# Patient Record
Sex: Male | Born: 1940 | Race: White | Hispanic: No | Smoking: Never smoker
Health system: Southern US, Community
[De-identification: ages and names within clinical notes are randomized; demographics above are authoritative.]

## PROBLEM LIST (undated history)

## (undated) DIAGNOSIS — N529 Male erectile dysfunction, unspecified: Secondary | ICD-10-CM

## (undated) DIAGNOSIS — E079 Disorder of thyroid, unspecified: Secondary | ICD-10-CM

## (undated) DIAGNOSIS — M109 Gout, unspecified: Secondary | ICD-10-CM

## (undated) DIAGNOSIS — R310 Gross hematuria: Secondary | ICD-10-CM

## (undated) DIAGNOSIS — N200 Calculus of kidney: Secondary | ICD-10-CM

## (undated) DIAGNOSIS — N400 Enlarged prostate without lower urinary tract symptoms: Secondary | ICD-10-CM

## (undated) DIAGNOSIS — I10 Essential (primary) hypertension: Secondary | ICD-10-CM

## (undated) DIAGNOSIS — K219 Gastro-esophageal reflux disease without esophagitis: Secondary | ICD-10-CM

## (undated) DIAGNOSIS — J45909 Unspecified asthma, uncomplicated: Secondary | ICD-10-CM

## (undated) DIAGNOSIS — R972 Elevated prostate specific antigen [PSA]: Secondary | ICD-10-CM

## (undated) DIAGNOSIS — R350 Frequency of micturition: Secondary | ICD-10-CM

## (undated) DIAGNOSIS — T7840XA Allergy, unspecified, initial encounter: Secondary | ICD-10-CM

## (undated) HISTORY — DX: Allergy, unspecified, initial encounter: T78.40XA

## (undated) HISTORY — DX: Disorder of thyroid, unspecified: E07.9

## (undated) HISTORY — DX: Calculus of kidney: N20.0

## (undated) HISTORY — DX: Gout, unspecified: M10.9

## (undated) HISTORY — DX: Elevated prostate specific antigen (PSA): R97.20

## (undated) HISTORY — DX: Gross hematuria: R31.0

## (undated) HISTORY — DX: Benign prostatic hyperplasia without lower urinary tract symptoms: N40.0

## (undated) HISTORY — DX: Male erectile dysfunction, unspecified: N52.9

## (undated) HISTORY — PX: OTHER SURGICAL HISTORY: SHX169

## (undated) HISTORY — DX: Frequency of micturition: R35.0

## (undated) HISTORY — DX: Unspecified asthma, uncomplicated: J45.909

## (undated) HISTORY — DX: Essential (primary) hypertension: I10

---

## 2003-09-30 ENCOUNTER — Encounter (INDEPENDENT_AMBULATORY_CARE_PROVIDER_SITE_OTHER): Payer: Self-pay | Admitting: Specialist

## 2003-09-30 ENCOUNTER — Ambulatory Visit (HOSPITAL_COMMUNITY): Admission: RE | Admit: 2003-09-30 | Discharge: 2003-09-30 | Payer: Self-pay | Admitting: Gastroenterology

## 2006-11-08 ENCOUNTER — Ambulatory Visit: Payer: Self-pay | Admitting: Urology

## 2006-12-06 ENCOUNTER — Other Ambulatory Visit: Payer: Self-pay

## 2006-12-06 ENCOUNTER — Ambulatory Visit: Payer: Self-pay | Admitting: Urology

## 2006-12-08 ENCOUNTER — Ambulatory Visit: Payer: Self-pay | Admitting: Urology

## 2007-04-19 ENCOUNTER — Other Ambulatory Visit: Payer: Self-pay

## 2007-04-19 ENCOUNTER — Observation Stay: Payer: Self-pay | Admitting: Internal Medicine

## 2010-10-16 NOTE — Op Note (Signed)
NAME:  Henry Shepard, Henry Shepard                           ACCOUNT NO.:  [id number]   MEDICAL RECORD NO.:  [id number]                   PATIENT TYPE:  AMB   LOCATION:  ENDO                                 FACILITY:  [facility]   PHYSICIAN:  John C. Madilyn Fireman, M.D.                 DATE OF BIRTH:  1940/11/02   DATE OF PROCEDURE:  09/30/2003  DATE OF DISCHARGE:                                 OPERATIVE REPORT   PROCEDURE:  Colonoscopy with polypectomy.   INDICATIONS FOR PROCEDURE:  Average risk colon cancer screening in a 70-year-  old patient.   DESCRIPTION OF PROCEDURE:  The patient was placed in the left lateral  decubitus position and placed on the pulse monitor with continuous low-flow  oxygen delivered by nasal cannula.  He was sedated with 87.5 mcg IV fentanyl  and 9 mg IV Versed.  The Olympus video colonoscope was inserted into the  rectum and advanced to the cecum, confirmed by transillumination of  McBurney's point and visualization of the ileocecal valve and appendiceal  orifice.  Prep was good.  The cecum appeared normal with no masses, polyps,  diverticula, or other mucosal abnormalities.  In the ascending colon, there  was seen an 8 mm sessile polyp that was removed by snare.  The remainder of  the ascending, transverse, and descending colon appeared normal with no  further masses, polyps, diverticula, or mucosal abnormalities.  The sigmoid  colon had a 6 mm polyp that was removed by hot biopsy.  The remainder of the  sigmoid and the rectum appeared normal.  The scope was then withdrawn and  the patient returned to the recovery room in stable condition.  He tolerated  the procedure well and there were no immediate complications.   IMPRESSION:  Ascending and sigmoid colon polyps.   PLAN:  Await pathology to determine__________ colon screening.                                               John C. Madilyn Fireman, M.D.    J[facility]/MEDQ  D:  09/30/2003  T:  09/30/2003  Job:  [id number]   cc:   Caryn Bee L.  Little, M.D.  [address]  [place]  [place] [address]  Fax: [phone]

## 2012-01-04 ENCOUNTER — Ambulatory Visit: Payer: Self-pay | Admitting: Urology

## 2012-01-13 ENCOUNTER — Ambulatory Visit: Payer: Self-pay | Admitting: Urology

## 2012-01-27 ENCOUNTER — Ambulatory Visit: Payer: Self-pay | Admitting: Urology

## 2012-02-02 ENCOUNTER — Ambulatory Visit: Payer: Self-pay | Admitting: Urology

## 2012-02-29 ENCOUNTER — Ambulatory Visit: Payer: Self-pay | Admitting: Urology

## 2013-12-08 IMAGING — CR DG ABDOMEN 1V
1 series · 2 of 2 positions shown · non-contrast
Comparison: none

REASON FOR EXAM: possible kidney stones
COMMENTS:

[Series 1: ap · 0.17mm/px · 2 of 2 slices shown]
[im 1/2]
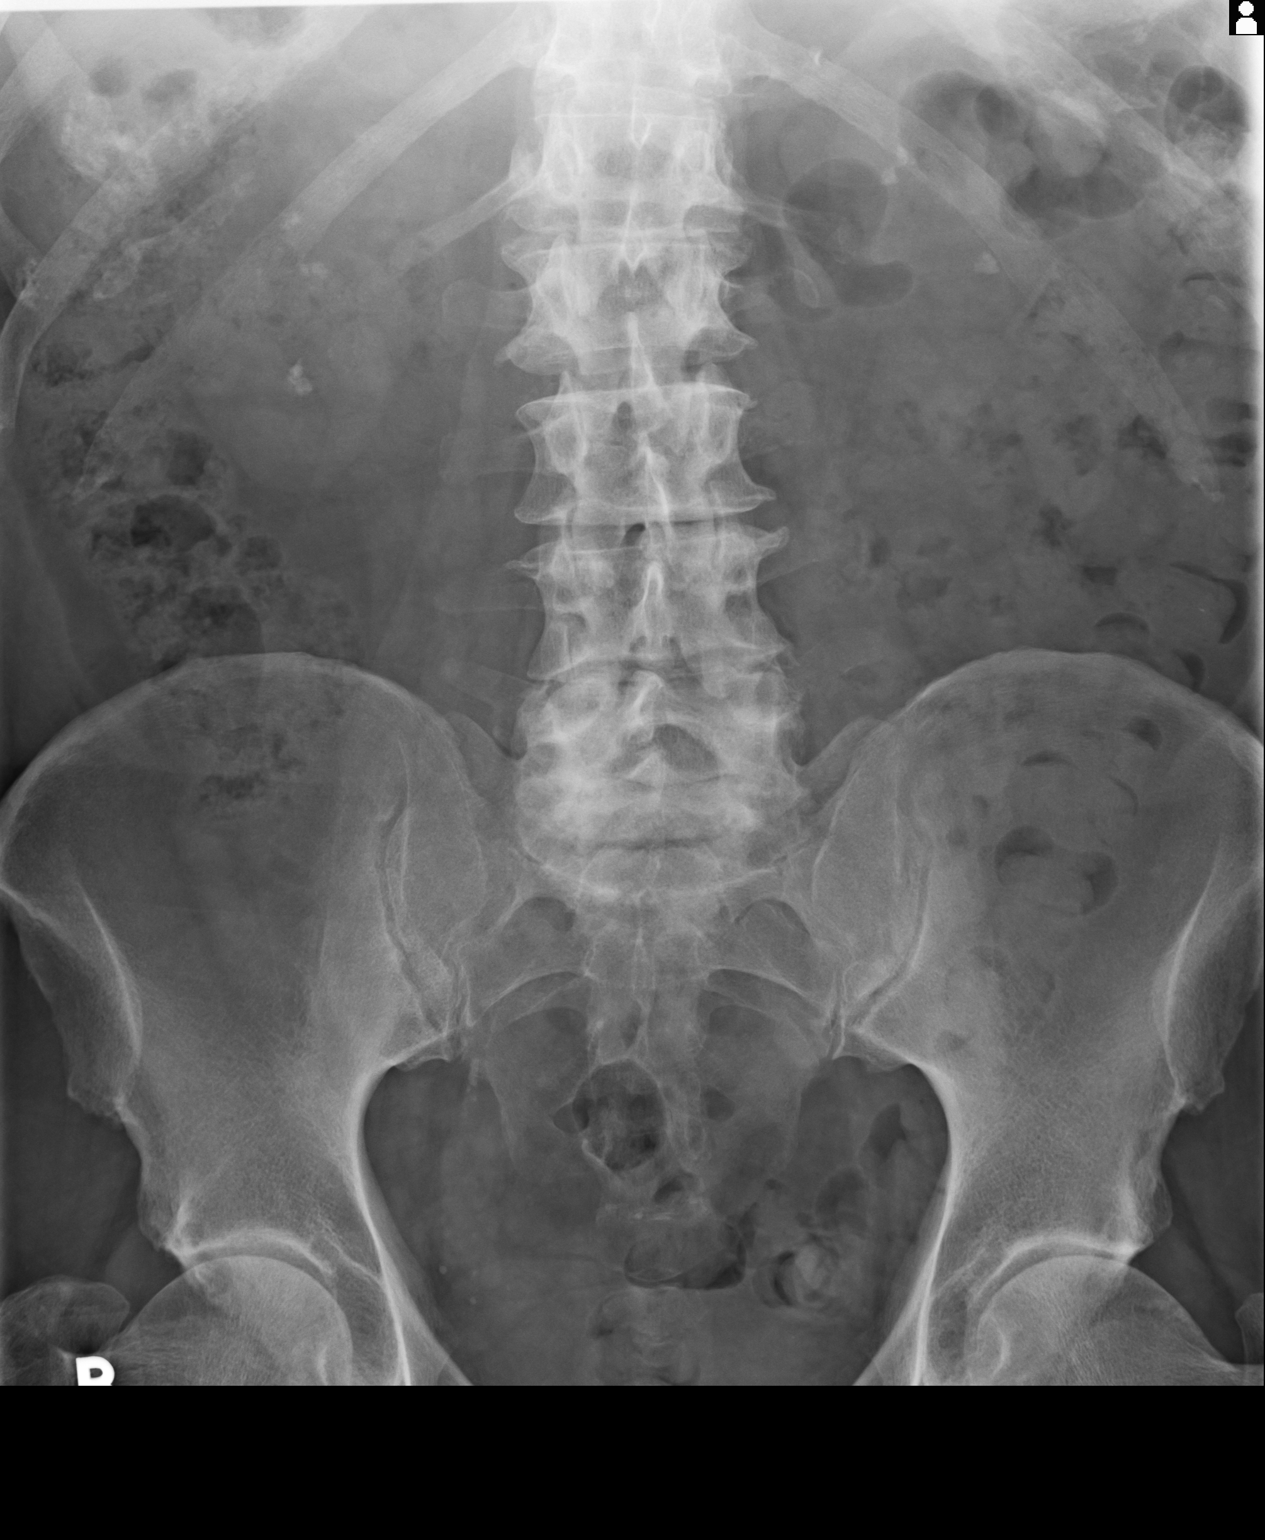
[im 2/2]
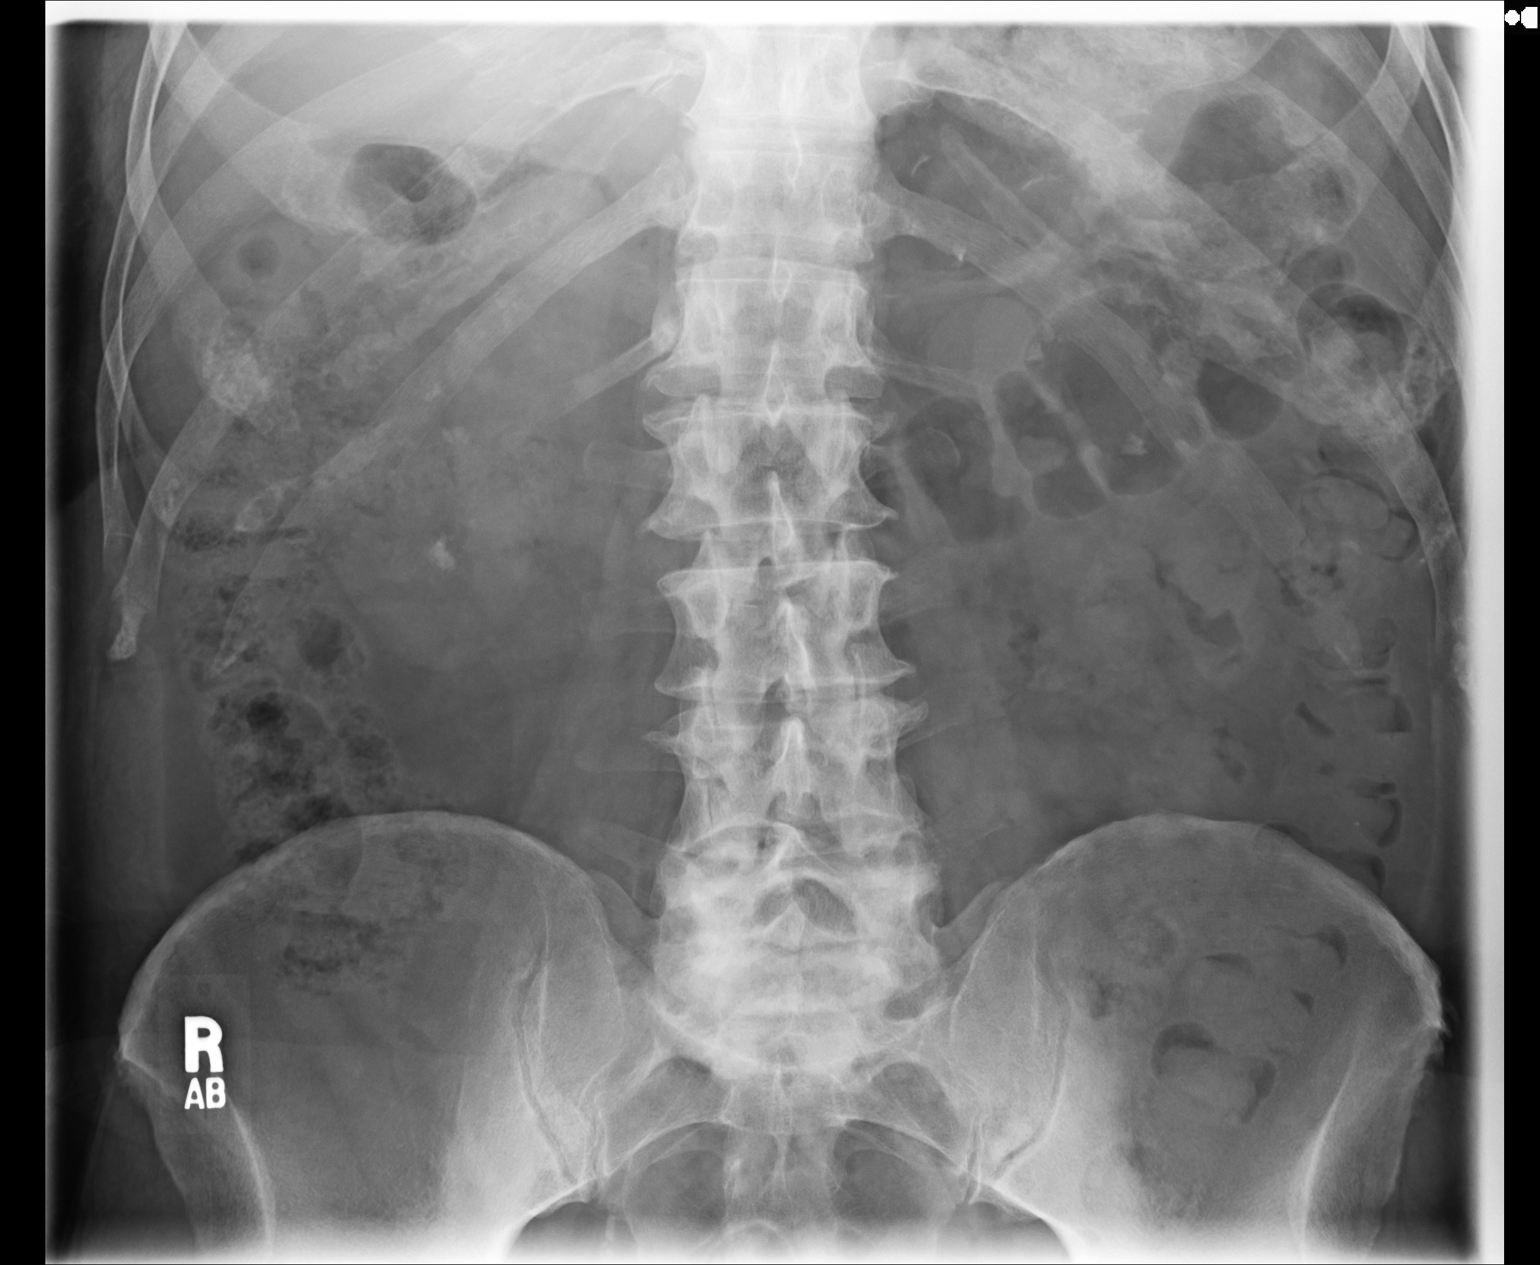

[2 of 2 positions shown; findings below may reference images not displayed]

PROCEDURE:     MDR - MDR KIDNEY URETER BLADDER  - February 02, 2012  [DATE]

RESULT:     A comparison is made to the study 27 January, 2012.

Bilateral multiple renal stones are present. No definite ureteral calculi
are appreciated. The bowel gas pattern is unremarkable. Phleboliths are
present in the right pelvis.
IMPRESSION: Bilateral nephrolithiasis. Prominent costochondral
calcification is noted. Degenerative changes are seen in the spine.

[REDACTED]

## 2014-12-24 ENCOUNTER — Encounter: Payer: Self-pay | Admitting: Urology

## 2014-12-24 ENCOUNTER — Ambulatory Visit (INDEPENDENT_AMBULATORY_CARE_PROVIDER_SITE_OTHER): Payer: Medicare Other | Admitting: Urology

## 2014-12-24 VITALS — BP 135/78 | HR 66 | Ht 71.0 in | Wt 200.9 lb

## 2014-12-24 DIAGNOSIS — N529 Male erectile dysfunction, unspecified: Secondary | ICD-10-CM

## 2014-12-24 DIAGNOSIS — N528 Other male erectile dysfunction: Secondary | ICD-10-CM | POA: Diagnosis not present

## 2014-12-24 DIAGNOSIS — N138 Other obstructive and reflux uropathy: Secondary | ICD-10-CM | POA: Insufficient documentation

## 2014-12-24 DIAGNOSIS — N401 Enlarged prostate with lower urinary tract symptoms: Secondary | ICD-10-CM

## 2014-12-24 DIAGNOSIS — Z87898 Personal history of other specified conditions: Secondary | ICD-10-CM | POA: Insufficient documentation

## 2014-12-24 LAB — BLADDER SCAN AMB NON-IMAGING: SCAN RESULT: 166

## 2014-12-24 MED ORDER — TAMSULOSIN HCL 0.4 MG PO CAPS: 0.4000 mg | ORAL_CAPSULE | Freq: Every day | ORAL | Status: DC

## 2014-12-24 NOTE — Progress Notes (Addendum)
12/24/2014 9:49 AM   Henry Shepard November 10, 1940 [id number]  Referring provider: No referring provider defined for this encounter.  Chief Complaint  Patient presents with  . Follow-up    PSA    HPI: Henry Shepard is a 74 year old white male with ED, a history of elevated PSA and BPH with LUTS who presents today for 6 months follow up.    His IPSS score today is 0, which is mild lower urinary tract symptomatology. He is delighted with his quality life due to his urinary symptoms. His PVR is 166 mL.    He has no complaints today.  He denies any dysuria, hematuria or suprapubic pain.   He currently taking tamsulosin.  He had been on Avodart in the remote past.   His has had a PSA of 26.9 ng/mL with negative biopsy on 11/10/2006.  Previous PSA's:    3.2 ng/mL on 12/18/2012    3.9 ng/mL on 06/22/2013    3.5 ng/mL on 12/21/2013    3.9 ng/mL on 06/24/2014     He also denies any recent fevers, chills, nausea or vomiting.  He does not have a family history of PCa.      IPSS      12/24/14 0900       International Prostate Symptom Score   How often have you had the sensation of not emptying your bladder? Not at All     How often have you had to urinate less than every two hours? Not at All     How often have you found you stopped and started again several times when you urinated? Not at All     How often have you found it difficult to postpone urination? Not at All     How often have you had a weak urinary stream? Not at All     How often have you had to strain to start urination? Not at All     How many times did you typically get up at night to urinate? None     Total IPSS Score 0     Quality of Life due to urinary symptoms   If you were to spend the rest of your life with your urinary condition just the way it is now how would you feel about that? Delighted        Score:  1-7 Mild 8-19 Moderate 20-35 Severe  His SHIM score is 9, which is moderate ED.   He has been  having difficulty with erections for over ten years .   His major complaint is getting and keeping an erection.  His libido is good.   His risk factors for ED are hypertension and hypothyroidism.  He denies any painful erections or curvatures with his erections.   He has tried Viagra and Cialis in the past.  Cialis is the most effective.          SHIM      12/24/14 0940       SHIM: Over the last 6 months:   How do you rate your confidence that you could get and keep an erection? Low     When you had erections with sexual stimulation, how often were your erections hard enough for penetration (entering your partner)? A Few Times (much less than half the time)     During sexual intercourse, how often were you able to maintain your erection after you had penetrated (entered) your partner? Very Difficult  During sexual intercourse, how difficult was it to maintain your erection to completion of intercourse? Very Difficult     When you attempted sexual intercourse, how often was it satisfactory for you? Extremely Difficult     SHIM Total Score   SHIM 9        Score: 1-7 Severe ED 8-11 Moderate ED 12-16 Mild-Moderate ED 17-21 Mild ED 22-25 No ED    He has a h/o elevated PSA.  He underwent a biopsy on 11/10/2006 for a PSA of 26.9 ng/mL with negative results.  So far, his repeated PSA's have been below 4.     PMH: Past Medical History  Diagnosis Date  . Kidney stone   . Hypertension   . Thyroid disease     Surgical History: History reviewed. No pertinent past surgical history.  Home Medications:    Medication List       This list is accurate as of: 12/24/14  9:49 AM.  Always use your most recent med list.               allopurinol 100 MG tablet  Commonly known as:  ZYLOPRIM  Take 100 mg by mouth daily.     metoprolol succinate 25 MG 24 hr tablet  Commonly known as:  TOPROL-XL  Take 25 mg by mouth daily.     SYNTHROID 50 MCG tablet  Generic drug:  levothyroxine   Take 50 mcg by mouth every morning.     tamsulosin 0.4 MG Caps capsule  Commonly known as:  FLOMAX  Take 0.4 mg by mouth daily.        Allergies: No Known Allergies  Family History: Family History  Problem Relation Age of Onset  . Kidney cancer Neg Hx   . Kidney disease Neg Hx   . Prostate cancer Neg Hx     Social History:  reports that he has never smoked. He does not have any smokeless tobacco history on file. He reports that he does not drink alcohol or use illicit drugs.  ROS: UROLOGY Frequent Urination?: No Hard to postpone urination?: No Burning/pain with urination?: No Get up at night to urinate?: No Leakage of urine?: No Urine stream starts and stops?: No Trouble starting stream?: No Do you have to strain to urinate?: No Blood in urine?: No Urinary tract infection?: No Sexually transmitted disease?: No Injury to kidneys or bladder?: No Painful intercourse?: No Weak stream?: No Erection problems?: Yes Penile pain?: No  Gastrointestinal Nausea?: No Vomiting?: No Indigestion/heartburn?: No Diarrhea?: No Constipation?: No  Constitutional Fever: No Night sweats?: No Weight loss?: No Fatigue?: No  Skin Skin rash/lesions?: Yes Itching?: No  Eyes Blurred vision?: No Double vision?: No  Ears/Nose/Throat Sore throat?: No Sinus problems?: No  Hematologic/Lymphatic Swollen glands?: No Easy bruising?: Yes  Cardiovascular Leg swelling?: No Chest pain?: No  Respiratory Cough?: No Shortness of breath?: No  Endocrine Excessive thirst?: No  Musculoskeletal Back pain?: No Joint pain?: No  Neurological Headaches?: No Dizziness?: No  Psychologic Depression?: No Anxiety?: No  Physical Exam: BP 135/78 mmHg  Pulse 66  Ht 5\' 11"  (1.803 m)  Wt 200 lb 14.4 oz (91.128 kg)  BMI 28.03 kg/m2  GU: Patient with uncircumcised  phallus. Foreskin easily retracted Urethral meatus is patent.  No penile discharge. Mild balanitis is present.   Scrotum without lesions, cysts, rashes and/or edema.  Testicles are located scrotally bilaterally. No masses are appreciated in the testicles. Left and right epididymis are normal. Rectal: Patient with  normal sphincter tone. Perineum without scarring or rashes. No rectal masses are appreciated. Prostate is approximately 50 grams, no nodules are appreciated. Seminal vesicles are normal. Of note, his right hip is bruised from a fall.    Laboratory Data: Results for orders placed or performed in visit on 12/24/14  BLADDER SCAN AMB NON-IMAGING  Result Value Ref Range   Scan Result 166    No results found for: WBC, HGB, HCT, MCV, PLT  No results found for: CREATININE  No results found for: PSA  No results found for: TESTOSTERONE  No results found for: HGBA1C  Urinalysis No results found for: COLORURINE, APPEARANCEUR, LABSPEC, PHURINE, GLUCOSEU, HGBUR, BILIRUBINUR, KETONESUR, PROTEINUR, UROBILINOGEN, NITRITE, LEUKOCYTESUR  Pertinent Imaging:   Assessment & Plan:    1. BPH with LUTS:   Patient's IPSS score is 0/0.  His PVR 166 mL.  His DRE demonstrates slight enlargement.  Patient will continue the tamsulosin.   We will continue medical management at this time.  He will follow up in 6 months for a PSA, DRE, PVR and an IPSS.    - BLADDER SCAN AMB NON-IMAGING  2. Erectile dysfunction:   Patient's SHIM score is 9.  He finds the Cialis effective, but cost prohibitive.  He is given samples of Cialis 20 mg today.    3. History of elevated PSA:   He underwent a biopsy on 11/10/2006 for a PSA of 26.9 ng/mL with negative results. We will continue to monitor closely and will see him in 6 months for a PSA and DRE.   Previous PSA's:    3.2 ng/mL on 12/18/2012    3.9 ng/mL on 06/22/2013    3.5 ng/mL on 12/21/2013    3.9 ng/mL on 06/24/2014      - PSA   No Follow-up on file.  Michiel Cowboy, PA-C  [facility] [address] [place], [place]  [address] [phone]

## 2014-12-25 ENCOUNTER — Telehealth: Payer: Self-pay

## 2014-12-25 DIAGNOSIS — R972 Elevated prostate specific antigen [PSA]: Secondary | ICD-10-CM

## 2014-12-25 LAB — PSA: PROSTATE SPECIFIC AG, SERUM: 3.4 ng/mL (ref 0.0–4.0)

## 2014-12-25 NOTE — Telephone Encounter (Signed)
-----   Message from Harle Battiest, PA-C sent at 12/25/2014  9:07 AM EDT ----- Patient's PSA is stable.  We will see him in 6 months.  PSA to be drawn before his next appointment.

## 2014-12-25 NOTE — Telephone Encounter (Signed)
Spoke with pt in reference to lab results. Pt voiced understanding. Orders have been placed.  

## 2015-06-15 ENCOUNTER — Encounter: Payer: Self-pay | Admitting: Emergency Medicine

## 2015-06-15 ENCOUNTER — Emergency Department
Admission: EM | Admit: 2015-06-15 | Discharge: 2015-06-15 | Disposition: A | Discharge status: Home / Self Care | Payer: Medicare Other | Attending: Emergency Medicine | Admitting: Emergency Medicine

## 2015-06-15 DIAGNOSIS — W06XXXA Fall from bed, initial encounter: Secondary | ICD-10-CM | POA: Diagnosis not present

## 2015-06-15 DIAGNOSIS — Y9289 Other specified places as the place of occurrence of the external cause: Secondary | ICD-10-CM | POA: Diagnosis not present

## 2015-06-15 DIAGNOSIS — Y998 Other external cause status: Secondary | ICD-10-CM | POA: Insufficient documentation

## 2015-06-15 DIAGNOSIS — S61412A Laceration without foreign body of left hand, initial encounter: Secondary | ICD-10-CM | POA: Diagnosis present

## 2015-06-15 DIAGNOSIS — Z79899 Other long term (current) drug therapy: Secondary | ICD-10-CM | POA: Diagnosis not present

## 2015-06-15 DIAGNOSIS — Y9389 Activity, other specified: Secondary | ICD-10-CM | POA: Insufficient documentation

## 2015-06-15 DIAGNOSIS — I10 Essential (primary) hypertension: Secondary | ICD-10-CM | POA: Insufficient documentation

## 2015-06-15 MED ORDER — CEPHALEXIN 500 MG PO CAPS: 500.0000 mg | ORAL_CAPSULE | Freq: Three times a day (TID) | ORAL | Status: AC

## 2015-06-15 MED ORDER — LIDOCAINE-EPINEPHRINE (PF) 1 %-1:200000 IJ SOLN
INTRAMUSCULAR | Status: AC
  Administered 2015-06-15: 30 mL via INTRADERMAL
  Filled 2015-06-15: qty 30

## 2015-06-15 MED ORDER — LIDOCAINE-EPINEPHRINE (PF) 1 %-1:200000 IJ SOLN
30.0000 mL | Freq: Once | INTRAMUSCULAR | Status: AC
  Administered 2015-06-15: 30 mL via INTRADERMAL

## 2015-06-15 NOTE — Discharge Instructions (Signed)
Laceration Care, Adult  A laceration is a cut that goes through all layers of the skin. The cut also goes into the tissue that is right under the skin. Some cuts heal on their own. Others need to be closed with stitches (sutures), staples, skin adhesive strips, or wound glue. Taking care of your cut lowers your risk of infection and helps your cut to heal better.  HOW TO TAKE CARE OF YOUR CUT  For stitches or staples:  · Keep the wound clean and dry.  · If you were given a bandage (dressing), you should change it at least one time per day or as told by your doctor. You should also change it if it gets wet or dirty.  · Keep the wound completely dry for the first 24 hours or as told by your doctor. After that time, you may take a shower or a bath. However, make sure that the wound is not soaked in water until after the stitches or staples have been removed.  · Clean the wound one time each day or as told by your doctor:    Wash the wound with soap and water.    Rinse the wound with water until all of the soap comes off.    Pat the wound dry with a clean towel. Do not rub the wound.  · After you clean the wound, put a thin layer of antibiotic ointment on it as told by your doctor. This ointment:    Helps to prevent infection.    Keeps the bandage from sticking to the wound.  · Have your stitches or staples removed as told by your doctor.  If your doctor used skin adhesive strips:   · Keep the wound clean and dry.  · If you were given a bandage, you should change it at least one time per day or as told by your doctor. You should also change it if it gets dirty or wet.  · Do not get the skin adhesive strips wet. You can take a shower or a bath, but be careful to keep the wound dry.  · If the wound gets wet, pat it dry with a clean towel. Do not rub the wound.  · Skin adhesive strips fall off on their own. You can trim the strips as the wound heals. Do not remove any strips that are still stuck to the wound. They will  fall off after a while.  If your doctor used wound glue:  · Try to keep your wound dry, but you may briefly wet it in the shower or bath. Do not soak the wound in water, such as by swimming.  · After you take a shower or a bath, gently pat the wound dry with a clean towel. Do not rub the wound.  · Do not do any activities that will make you really sweaty until the skin glue has fallen off on its own.  · Do not apply liquid, cream, or ointment medicine to your wound while the skin glue is still on.  · If you were given a bandage, you should change it at least one time per day or as told by your doctor. You should also change it if it gets dirty or wet.  · If a bandage is placed over the wound, do not let the tape for the bandage touch the skin glue.  · Do not pick at the glue. The skin glue usually stays on for 5-10 days. Then, it   falls off of the skin.  General Instructions   · To help prevent scarring, make sure to cover your wound with sunscreen whenever you are outside after stitches are removed, after adhesive strips are removed, or when wound glue stays in place and the wound is healed. Make sure to wear a sunscreen of at least 30 SPF.  · Take over-the-counter and prescription medicines only as told by your doctor.  · If you were given antibiotic medicine or ointment, take or apply it as told by your doctor. Do not stop using the antibiotic even if your wound is getting better.  · Do not scratch or pick at the wound.  · Keep all follow-up visits as told by your doctor. This is important.  · Check your wound every day for signs of infection. Watch for:    Redness, swelling, or pain.    Fluid, blood, or pus.  · Raise (elevate) the injured area above the level of your heart while you are sitting or lying down, if possible.  GET HELP IF:  · You got a tetanus shot and you have any of these problems at the injection site:    Swelling.    Very bad pain.    Redness.    Bleeding.  · You have a fever.  · A wound that was  closed breaks open.  · You notice a bad smell coming from your wound or your bandage.  · You notice something coming out of the wound, such as wood or glass.  · Medicine does not help your pain.  · You have more redness, swelling, or pain at the site of your wound.  · You have fluid, blood, or pus coming from your wound.  · You notice a change in the color of your skin near your wound.  · You need to change the bandage often because fluid, blood, or pus is coming from the wound.  · You start to have a new rash.  · You start to have numbness around the wound.  GET HELP RIGHT AWAY IF:  · You have very bad swelling around the wound.  · Your pain suddenly gets worse and is very bad.  · You notice painful lumps near the wound or on skin that is anywhere on your body.  · You have a red streak going away from your wound.  · The wound is on your hand or foot and you cannot move a finger or toe like you usually can.  · The wound is on your hand or foot and you notice that your fingers or toes look pale or bluish.     This information is not intended to replace advice given to you by your health care provider. Make sure you discuss any questions you have with your health care provider.     Document Released: 11/03/2007 Document Revised: 10/01/2014 Document Reviewed: 05/13/2014  Elsevier Interactive Patient Education ©2016 [employer]

## 2015-06-15 NOTE — ED Notes (Signed)
Patient presents to the ED with a laceration/skin tear on his left hand.  Patient states he was attempting to get in a bunk bed last night and fell and hurt his hand.  Patient is unsure what he hit his hand on. Patient denies hitting his head, denies losing consciousness.  Patient is a very spry [age] year old.

## 2015-06-15 NOTE — ED Provider Notes (Signed)
CSN: [id number]     Arrival date & time 06/15/15  1115 History   First MD Initiated Contact with Patient 06/15/15 1152     Chief Complaint  Patient presents with  . Extremity Laceration     (Consider location/radiation/quality/duration/timing/severity/associated sxs/prior Treatment) HPI  75 year old male presents emergency department for evaluation of a left hand laceration. Laceration occurred sometime earlier this morning, he slipped and fell while getting out of the bed. Patient denies any significant pain but has a laceration to left hand. This was cleansed and dressed at home. He states his tetanus is up-to-date. Pain is mild. Denies any pain with full composite fist. He is not on any anticoagulant for medications.  Past Medical History  Diagnosis Date  . Kidney stone   . Hypertension   . Thyroid disease    History reviewed. No pertinent past surgical history. Family History  Problem Relation Age of Onset  . Kidney cancer Neg Hx   . Kidney disease Neg Hx   . Prostate cancer Neg Hx    Social History  Substance Use Topics  . Smoking status: Never Smoker   . Smokeless tobacco: None  . Alcohol Use: No    Review of Systems  Constitutional: Negative.  Negative for fever, chills, activity change and appetite change.  HENT: Negative for congestion, ear pain, mouth sores, rhinorrhea, sinus pressure, sore throat and trouble swallowing.   Eyes: Negative for photophobia, pain and discharge.  Respiratory: Negative for cough, chest tightness and shortness of breath.   Cardiovascular: Negative for chest pain and leg swelling.  Gastrointestinal: Negative for nausea, vomiting, abdominal pain, diarrhea and abdominal distention.  Genitourinary: Negative for dysuria and difficulty urinating.  Musculoskeletal: Negative for back pain, arthralgias and gait problem.  Skin: Positive for wound. Negative for color change and rash.  Neurological: Negative for dizziness and headaches.   Hematological: Negative for adenopathy.  Psychiatric/Behavioral: Negative for behavioral problems and agitation.      Allergies  Review of patient's allergies indicates no known allergies.  Home Medications   Prior to Admission medications   Medication Sig Start Date End Date Taking? Authorizing Provider  allopurinol (ZYLOPRIM) 100 MG tablet Take 100 mg by mouth daily. 11/17/14   Historical Provider, MD  cephALEXin (KEFLEX) 500 MG capsule Take 1 capsule (500 mg total) by mouth 3 (three) times daily. 06/15/15 06/25/15  Evon Slack, PA-C  metoprolol succinate (TOPROL-XL) 25 MG 24 hr tablet Take 25 mg by mouth daily. 11/17/14   Historical Provider, MD  SYNTHROID 50 MCG tablet Take 50 mcg by mouth every morning. 09/24/14   Historical Provider, MD  tamsulosin (FLOMAX) 0.4 MG CAPS capsule Take 1 capsule (0.4 mg total) by mouth daily. 12/24/14   Carollee Herter A McGowan, PA-C   BP 139/96 mmHg  Pulse 73  Temp(Src) 98.3 F (36.8 C) (Oral)  Resp 16  Ht 5\' 11"  (1.803 m)  Wt 86.183 kg  BMI 26.51 kg/m2  SpO2 100% Physical Exam  Constitutional: He is oriented to person, place, and time. He appears well-developed and well-nourished.  HENT:  Head: Normocephalic and atraumatic.  Eyes: Conjunctivae and EOM are normal. Pupils are equal, round, and reactive to light.  Neck: Normal range of motion. Neck supple.  Cardiovascular: Normal rate and intact distal pulses.   Pulmonary/Chest: Effort normal. No respiratory distress.  Abdominal: Soft. Bowel sounds are normal.  Musculoskeletal: Normal range of motion.  Examination of the left hand shows patient has full composite fist with grip strength 5 out of  5. There is a a similar laceration on the dorsal and ulnar aspect of the left hand. The tenderness palpation along the MCP joint or metacarpals. No sign of foreign body. Bleeding well controlled. Sensation intact throughout the left upper extremity.  Neurological: He is alert and oriented to person, place,  and time.  Skin: Skin is warm and dry.  Psychiatric: He has a normal mood and affect. His behavior is normal. Judgment and thought content normal.    ED Course  Procedures (including critical care time) LACERATION REPAIR Performed by: Patience MuscaGAINES, Deziree Mokry CHRISTOPHER Authorized by: Patience MuscaGAINES, Caytlin Better CHRISTOPHER Consent: Verbal consent obtained. Risks and benefits: risks, benefits and alternatives were discussed Consent given by: patient Patient identity confirmed: provided demographic data Prepped and Draped in normal sterile fashion Wound explored  Laceration Location: Left hand  Laceration Length: 8 cm  No Foreign Bodies seen or palpated  Anesthesia: local infiltration  Local anesthetic: lidocaine 1% % with epinephrine  Anesthetic total: 4 ml  Irrigation method: syringe Amount of cleaning: standard  Skin closure: 5-0 nylon   Number of sutures: 12   Technique: Running suture   Patient tolerance: Patient tolerated the procedure well with no immediate complications.  Labs Review Labs Reviewed - No data to display  Imaging Review No results found. I have personally reviewed and evaluated these images and lab results as part of my medical decision-making.   EKG Interpretation None      MDM   Final diagnoses:  Hand laceration, left, initial encounter    75 year old male with left hand laceration occurred earlier this morning. Tetanus is up-to-date. Laceration was repaired with sutures. He will keep clean and dry, change dressing daily. He is given prophylactic antibiotics, Keflex. Skin tears, he will keep covered and dry with nonocclusive dressing. Follow-up with emergency department or walk-in clinic in 7-10 days for suture removal.    Evon Slackhomas C Jarika Robben, PA-C 06/15/15 1249  Governor Rooksebecca Lord, MD 06/15/15 1527

## 2015-06-17 ENCOUNTER — Other Ambulatory Visit: Payer: Medicare Other

## 2015-06-17 DIAGNOSIS — R972 Elevated prostate specific antigen [PSA]: Secondary | ICD-10-CM

## 2015-06-18 LAB — PSA: Prostate Specific Ag, Serum: 3.5 ng/mL (ref 0.0–4.0)

## 2015-06-19 ENCOUNTER — Other Ambulatory Visit: Payer: Medicare Other

## 2015-06-24 ENCOUNTER — Encounter: Payer: Self-pay | Admitting: *Deleted

## 2015-06-26 ENCOUNTER — Ambulatory Visit: Payer: Medicare Other | Admitting: Urology

## 2015-06-30 ENCOUNTER — Ambulatory Visit (INDEPENDENT_AMBULATORY_CARE_PROVIDER_SITE_OTHER): Payer: Medicare Other | Admitting: Urology

## 2015-06-30 ENCOUNTER — Encounter: Payer: Self-pay | Admitting: Urology

## 2015-06-30 VITALS — BP 129/76 | HR 71 | Ht 71.0 in | Wt 194.9 lb

## 2015-06-30 DIAGNOSIS — Z87898 Personal history of other specified conditions: Secondary | ICD-10-CM

## 2015-06-30 DIAGNOSIS — N528 Other male erectile dysfunction: Secondary | ICD-10-CM

## 2015-06-30 DIAGNOSIS — N138 Other obstructive and reflux uropathy: Secondary | ICD-10-CM

## 2015-06-30 DIAGNOSIS — N529 Male erectile dysfunction, unspecified: Secondary | ICD-10-CM

## 2015-06-30 DIAGNOSIS — N401 Enlarged prostate with lower urinary tract symptoms: Secondary | ICD-10-CM

## 2015-06-30 DIAGNOSIS — N400 Enlarged prostate without lower urinary tract symptoms: Secondary | ICD-10-CM

## 2015-06-30 NOTE — Progress Notes (Signed)
10:35 AM   Henry Shepard [birth year] [id number]  Referring provider: Alan Mulder, MD [address] [place], [place] [address]  Chief Complaint  Patient presents with  . Benign Prostatic Hypertrophy    6 month follow up  . Erectile Dysfunction    HPI: [name] is a 75 year old white male with ED, a history of elevated PSA and BPH with LUTS who presents today for 6 months follow up.    Erectile dysfunction Patient's SHIM score is 5 today which  indicates severe erectile dysfunction.  He has been having difficulty with erections for over ten years .   His major complaint is getting and keeping an erection.  His libido is good.   His risk factors for ED are hypertension and hypothyroidism.  He denies any painful erections or curvatures with his erections.   He has tried Viagra and Cialis in the past.  Cialis is the most effective.  His girlfriend will not engage in sexual intercourse until they are married. The patient does not want to get married, so erectile dysfunction is not a bother for him at this time.  History of elevated PSA He underwent a biopsy on 11/10/2006 for a PSA of 26.9 ng/mL with negative results.  His most recent PSA is 3.5 ng/mL on 06/17/2015.    BPH with LUTS His IPSS score today is 0, which is no lower urinary tract symptomatology. He is delighted with his quality life due to his urinary symptoms.  He has no complaints today.  He denies any dysuria, hematuria or suprapubic pain.  He currently taking tamsulosin.  He also denies any recent fevers, chills, nausea or vomiting.  He does not have a family history of PCa.      IPSS      06/30/15 0900       International Prostate Symptom Score   How often have you had the sensation of not emptying your bladder? Not at All     How often have you had to urinate less than every two hours? Not at All     How often have you found you stopped and started again several times when you urinated? Not at All     How often  have you found it difficult to postpone urination? Not at All     How often have you had a weak urinary stream? Not at All     How often have you had to strain to start urination? Not at All     How many times did you typically get up at night to urinate? None     Total IPSS Score 0     Quality of Life due to urinary symptoms   If you were to spend the rest of your life with your urinary condition just the way it is now how would you feel about that? Delighted        Score:  1-7 Mild 8-19 Moderate 20-35 Severe        SHIM      06/30/15 0959       SHIM: Over the last 6 months:   How do you rate your confidence that you could get and keep an erection? Very Low     When you had erections with sexual stimulation, how often were your erections hard enough for penetration (entering your partner)? Almost Never or Never     During sexual intercourse, how often were you able to maintain your erection after  you had penetrated (entered) your partner? Extremely Difficult     During sexual intercourse, how difficult was it to maintain your erection to completion of intercourse? Extremely Difficult     When you attempted sexual intercourse, how often was it satisfactory for you? Extremely Difficult     SHIM Total Score   SHIM 5        Score: 1-7 Severe ED 8-11 Moderate ED 12-16 Mild-Moderate ED 17-21 Mild ED 22-25 No ED     PMH: Past Medical History  Diagnosis Date  . Kidney stone   . Hypertension   . Thyroid disease   . Gout   . Elevated PSA   . Urinary frequency   . Hypertension   . ED (erectile dysfunction)   . BPH (benign prostatic hyperplasia)   . Gross hematuria     Surgical History: Past Surgical History  Procedure Laterality Date  . None      Home Medications:    Medication List       This list is accurate as of: 06/30/15 10:35 AM.  Always use your most recent med list.               allopurinol 100 MG tablet  Commonly known as:  ZYLOPRIM  Take  100 mg by mouth daily.     metoprolol succinate 25 MG 24 hr tablet  Commonly known as:  TOPROL-XL  Take 25 mg by mouth daily.     SYNTHROID 50 MCG tablet  Generic drug:  levothyroxine  Take 50 mcg by mouth every morning.     tamsulosin 0.4 MG Caps capsule  Commonly known as:  FLOMAX  Take 1 capsule (0.4 mg total) by mouth daily.        Allergies: No Known Allergies  Family History: Family History  Problem Relation Age of Onset  . Kidney cancer Neg Hx   . Kidney disease Neg Hx   . Prostate cancer Neg Hx     Social History:  reports that he has never smoked. He does not have any smokeless tobacco history on file. He reports that he does not drink alcohol or use illicit drugs.  ROS: UROLOGY Frequent Urination?: No Hard to postpone urination?: No Burning/pain with urination?: No Get up at night to urinate?: No Leakage of urine?: No Urine stream starts and stops?: No Trouble starting stream?: No Do you have to strain to urinate?: No Blood in urine?: No Urinary tract infection?: No Sexually transmitted disease?: No Injury to kidneys or bladder?: No Painful intercourse?: No Weak stream?: No Erection problems?: No Penile pain?: No  Gastrointestinal Nausea?: No Vomiting?: No Indigestion/heartburn?: No Diarrhea?: No Constipation?: No  Constitutional Fever: No Night sweats?: No Weight loss?: No Fatigue?: No  Skin Skin rash/lesions?: No Itching?: No  Eyes Blurred vision?: No Double vision?: No  Ears/Nose/Throat Sore throat?: No Sinus problems?: No  Hematologic/Lymphatic Swollen glands?: No Easy bruising?: No  Cardiovascular Leg swelling?: No Chest pain?: No  Respiratory Cough?: No Shortness of breath?: No  Endocrine Excessive thirst?: No  Musculoskeletal Back pain?: No Joint pain?: No  Neurological Headaches?: No Dizziness?: No  Psychologic Depression?: No Anxiety?: No  Physical Exam: BP 129/76 mmHg  Pulse 71  Ht  (1.803  m)  Wt 194 lb 14.4 oz (88.406 kg)  BMI 27.20 kg/m2  GU: Patient with uncircumcised  phallus. Foreskin easily retracted Urethral meatus is patent.  No penile discharge. Mild balanitis is present.  Scrotum without lesions, cysts, rashes and/or edema.  Testicles are located scrotally bilaterally. No masses are appreciated in the testicles. Left and right epididymis are normal. Rectal: Patient with  normal sphincter tone. Perineum without scarring or rashes. No rectal masses are appreciated. Prostate is approximately 50 grams, no nodules are appreciated. Seminal vesicles are normal. Of note, his right hip is bruised from a fall.    Laboratory Data: Previous PSA's:    3.2 ng/mL on 12/18/2012    3.9 ng/mL on 06/22/2013    3.5 ng/mL on 12/21/2013    3.9 ng/mL on 06/24/2014  Results for orders placed or performed in visit on 06/17/15  PSA  Result Value Ref Range   Prostate Specific Ag, Serum 3.5 0.0 - 4.0 ng/mL     Assessment & Plan:    1. BPH with LUTS:   Patient will continue the tamsulosin.   We will continue medical management at this time.  He will follow up in 6 months for a PSA, exam, PVR and an IPSS.    2. Erectile dysfunction:   Patient's SHIM score is 5.  He finds the Cialis effective, but cost prohibitive.  His girlfriend is not interested in sexual activity at this time.  3. History of elevated PSA:   He underwent a biopsy on 11/10/2006 for a PSA of 26.9 ng/mL with negative results. We will continue to monitor closely and will see him in 6 months for a PSA and DRE.     Return in about 6 months (around 12/28/2015) for IPSS score and exam.  Michiel Cowboy, [facility] [address], [address] [place], [place] [address] [phone]

## 2015-08-22 ENCOUNTER — Other Ambulatory Visit: Payer: Self-pay | Admitting: Urology

## 2015-12-15 ENCOUNTER — Other Ambulatory Visit: Payer: Self-pay

## 2015-12-15 DIAGNOSIS — R972 Elevated prostate specific antigen [PSA]: Secondary | ICD-10-CM

## 2015-12-16 ENCOUNTER — Other Ambulatory Visit: Payer: Medicare Other

## 2015-12-16 DIAGNOSIS — R972 Elevated prostate specific antigen [PSA]: Secondary | ICD-10-CM

## 2015-12-17 LAB — PSA: Prostate Specific Ag, Serum: 3.1 ng/mL (ref 0.0–4.0)

## 2015-12-22 ENCOUNTER — Encounter: Payer: Self-pay | Admitting: Urology

## 2015-12-22 ENCOUNTER — Ambulatory Visit (INDEPENDENT_AMBULATORY_CARE_PROVIDER_SITE_OTHER): Payer: Medicare Other | Admitting: Urology

## 2015-12-22 VITALS — BP 138/79 | HR 62 | Ht 71.0 in | Wt 197.5 lb

## 2015-12-22 DIAGNOSIS — N200 Calculus of kidney: Secondary | ICD-10-CM | POA: Diagnosis not present

## 2015-12-22 DIAGNOSIS — N138 Other obstructive and reflux uropathy: Secondary | ICD-10-CM

## 2015-12-22 DIAGNOSIS — N529 Male erectile dysfunction, unspecified: Secondary | ICD-10-CM

## 2015-12-22 DIAGNOSIS — N401 Enlarged prostate with lower urinary tract symptoms: Secondary | ICD-10-CM

## 2015-12-22 DIAGNOSIS — Z87898 Personal history of other specified conditions: Secondary | ICD-10-CM | POA: Diagnosis not present

## 2015-12-22 DIAGNOSIS — N528 Other male erectile dysfunction: Secondary | ICD-10-CM | POA: Diagnosis not present

## 2015-12-22 MED ORDER — TAMSULOSIN HCL 0.4 MG PO CAPS: 0.4000 mg | ORAL_CAPSULE | Freq: Every day | ORAL | 4 refills | Status: DC

## 2015-12-22 NOTE — Progress Notes (Signed)
10:03 AM   Henry Shepard 06/03/1940 [id number]  Referring provider: Alan Mulder, MD [address] [place], [place] [address]  Chief Complaint  Patient presents with  . Benign Prostatic Hypertrophy    6 month follow up   . Erectile Dysfunction    HPI: Henry Shepard is a 75 year old Caucasian male with ED, a history of elevated PSA and BPH with LUTS who presents today for 6 months follow up.    Erectile dysfunction Patient's SHIM score is 5 today which  indicates severe erectile dysfunction.  He has been having difficulty with erections for over ten years .   His major complaint is getting and keeping an erection.  His libido is good.   His risk factors for ED are hypertension and hypothyroidism.  He denies any painful erections or curvatures with his erections.   He has tried Viagra and Cialis in the past.  Cialis is the most effective.  His girlfriend will not engage in sexual intercourse until they are married. The patient does not want to get married, so erectile dysfunction is not a bother for him at this time.  History of elevated PSA He underwent a biopsy on 11/10/2006 for a PSA of 26.9 ng/mL with negative results.  His most recent PSA is 3.1 ng/mL on 12/17/2015.    BPH with LUTS His IPSS score today is 0, which is no lower urinary tract symptomatology. He is delighted with his quality life due to his urinary symptoms.  He has no complaints today.  He denies any dysuria, hematuria or suprapubic pain.  He currently taking tamsulosin.  He also denies any recent fevers, chills, nausea or vomiting.  He does not have a family history of PCa.      IPSS    Row Name 12/22/15 0900         International Prostate Symptom Score   How often have you had the sensation of not emptying your bladder? Not at All     How often have you had to urinate less than every two hours? Not at All     How often have you found you stopped and started again several times when you urinated? Not at All      How often have you found it difficult to postpone urination? Not at All     How often have you had a weak urinary stream? Not at All     How often have you had to strain to start urination? Not at All     How many times did you typically get up at night to urinate? None     Total IPSS Score 0       Quality of Life due to urinary symptoms   If you were to spend the rest of your life with your urinary condition just the way it is now how would you feel about that? Delighted        Score:  1-7 Mild 8-19 Moderate 20-35 Severe   Nephrolithiasis Patient continues to pass stones at a frequency of one a year.  He does not have hematuria.  He has not had fevers, chills, nausea or vomiting.      PMH: Past Medical History:  Diagnosis Date  . BPH (benign prostatic hyperplasia)   . ED (erectile dysfunction)   . Elevated PSA   . Gout   . Gross hematuria   . Hypertension   . Hypertension   . Kidney stone   .  Thyroid disease   . Urinary frequency     Surgical History: Past Surgical History:  Procedure Laterality Date  . none      Home Medications:    Medication List       Accurate as of 12/22/15 10:03 AM. Always use your most recent med list.          allopurinol 100 MG tablet Commonly known as:  ZYLOPRIM Take 100 mg by mouth daily.   metoprolol succinate 25 MG 24 hr tablet Commonly known as:  TOPROL-XL Take 25 mg by mouth daily.   sildenafil 20 MG tablet Commonly known as:  REVATIO TAKE 2-5 TABLETS BY MOUTH AS NEEDED FOR SEXUAL ACTIVITY   SYNTHROID 50 MCG tablet Generic drug:  levothyroxine Take 50 mcg by mouth every morning.   tamsulosin 0.4 MG Caps capsule Commonly known as:  FLOMAX Take 1 capsule (0.4 mg total) by mouth daily.       Allergies: No Known Allergies  Family History: Family History  Problem Relation Age of Onset  . Kidney cancer Neg Hx   . Kidney disease Neg Hx   . Prostate cancer Neg Hx     Social History:  reports that he has  never smoked. He does not have any smokeless tobacco history on file. His alcohol and drug histories are not on file.  ROS: UROLOGY Frequent Urination?: No Hard to postpone urination?: No Burning/pain with urination?: No Get up at night to urinate?: No Leakage of urine?: No Urine stream starts and stops?: No Trouble starting stream?: No Do you have to strain to urinate?: No Blood in urine?: No Urinary tract infection?: No Sexually transmitted disease?: No Injury to kidneys or bladder?: No Painful intercourse?: No Weak stream?: No Erection problems?: No Penile pain?: No  Gastrointestinal Nausea?: No Vomiting?: No Indigestion/heartburn?: No Diarrhea?: No Constipation?: No  Constitutional Fever: No Night sweats?: No Weight loss?: No Fatigue?: No  Skin Skin rash/lesions?: No Itching?: No  Eyes Blurred vision?: No Double vision?: No  Ears/Nose/Throat Sore throat?: No Sinus problems?: No  Hematologic/Lymphatic Swollen glands?: No Easy bruising?: No  Cardiovascular Leg swelling?: No Chest pain?: No  Respiratory Cough?: No Shortness of breath?: No  Endocrine Excessive thirst?: No  Musculoskeletal Back pain?: No Joint pain?: No  Neurological Headaches?: No Dizziness?: No  Psychologic Depression?: No Anxiety?: No  Physical Exam: BP 138/79   Pulse 62   Ht  (1.803 m)   Wt 197 lb 8 oz (89.6 kg)   BMI 27.55 kg/m   GU: Patient with uncircumcised  phallus. Foreskin easily retracted Urethral meatus is patent.  No penile discharge. Mild balanitis is present.  Scrotum without lesions, cysts, rashes and/or edema.  Testicles are located scrotally bilaterally. No masses are appreciated in the testicles. Left and right epididymis are normal. Rectal: Patient with  normal sphincter tone. Perineum without scarring or rashes. No rectal masses are appreciated. Prostate is approximately 50 grams, no nodules are appreciated. Seminal vesicles are normal. Of  note, his right hip is bruised from a fall.    Laboratory Data: Previous PSA's:    3.2 ng/mL on 12/18/2012    3.9 ng/mL on 06/22/2013    3.5 ng/mL on 12/21/2013    3.9 ng/mL on 06/24/2014  Results for orders placed or performed in visit on 12/16/15  PSA  Result Value Ref Range   Prostate Specific Ag, Serum 3.1 0.0 - 4.0 ng/mL     Assessment & Plan:    1. BPH with LUTS:  Patient will continue the tamsulosin.   We will continue medical management at this time.  He will follow up in 6 months for a PSA, exam and an IPSS.    2. Erectile dysfunction:   Patient's SHIM score is 5.  He finds the Cialis effective, but cost prohibitive.  His girlfriend is not interested in sexual activity at this time.  3. History of elevated PSA:   He underwent a biopsy on 11/10/2006 for a PSA of 26.9 ng/mL with negative results. Current PSA is 3.1.  We will continue to monitor closely and will see him in 6 months for a PSA and DRE.    4. Nephrolithiasis:   Patient does not want any further work up for his stones at this time.     Return in about 6 months (around 06/23/2016) for IPSS, PSA and exam.  Michiel Cowboy, [facility]  [facility] [address] [place], [place] [address] [phone]

## 2016-05-31 HISTORY — PX: ESOPHAGEAL DILATION: SHX303

## 2016-05-31 HISTORY — PX: COLONOSCOPY W/ BIOPSIES AND POLYPECTOMY: SHX1376

## 2016-06-10 ENCOUNTER — Other Ambulatory Visit: Payer: Self-pay

## 2016-06-10 DIAGNOSIS — N401 Enlarged prostate with lower urinary tract symptoms: Secondary | ICD-10-CM

## 2016-06-15 ENCOUNTER — Other Ambulatory Visit: Payer: Medicare Other

## 2016-06-15 DIAGNOSIS — N401 Enlarged prostate with lower urinary tract symptoms: Secondary | ICD-10-CM

## 2016-06-16 LAB — PSA: Prostate Specific Ag, Serum: 4.6 ng/mL — ABNORMAL HIGH (ref 0.0–4.0)

## 2016-06-20 NOTE — Progress Notes (Signed)
9:02 AM   Henry Shepard April 15, 1941 [id number]  Referring provider: Alan Mulder, MD [address] [place], [place] [address]  Chief Complaint  Patient presents with  . Benign Prostatic Hypertrophy    68month    HPI: Henry Shepard is a 76 year old Caucasian male with ED, a history of elevated PSA and BPH with LUTS who presents today for 6 months follow up.    Erectile dysfunction Patient's SHIM score was 5 which  indicates severe erectile dysfunction.  He has been having difficulty with erections for over ten years .   His major complaint is getting and keeping an erection.  His libido is good.   His risk factors for ED are hypertension and hypothyroidism.  He denies any painful erections or curvatures with his erections.   He has tried Viagra and Cialis in the past.  Cialis is the most effective.  His girlfriend will not engage in sexual intercourse until they are married. The patient does not want to get married, so erectile dysfunction is not a bother for him at this time.  History of elevated PSA He underwent a biopsy on 11/10/2006 for a PSA of 26.9 ng/mL with negative results.  His most recent PSA is 4.6 ng/mL on 06/15/2016.  Marland Kitchen    BPH with LUTS His IPSS score today is 0, which is no lower urinary tract symptomatology. He is delighted with his quality life due to his urinary symptoms.  His previous I PSS score was 0/0.  He has no complaints today.  He denies any dysuria, hematuria or suprapubic pain.  He currently taking tamsulosin.  He also denies any recent fevers, chills, nausea or vomiting.  He does not have a family history of PCa.      IPSS    Row Name 06/21/16 0800         International Prostate Symptom Score   How often have you had the sensation of not emptying your bladder? Not at All     How often have you had to urinate less than every two hours? Not at All     How often have you found you stopped and started again several times when you urinated? Not at All     How often have you found it difficult to postpone urination? Not at All     How often have you had a weak urinary stream? Not at All     How often have you had to strain to start urination? Not at All     How many times did you typically get up at night to urinate? None     Total IPSS Score 0       Quality of Life due to urinary symptoms   If you were to spend the rest of your life with your urinary condition just the way it is now how would you feel about that? Delighted        Score:  1-7 Mild 8-19 Moderate 20-35 Severe   Nephrolithiasis Patient continues to pass stones at a frequency of one a year.  He does not have hematuria.  He has not had fevers, chills, nausea or vomiting.    PMH: Past Medical History:  Diagnosis Date  . BPH (benign prostatic hyperplasia)   . ED (erectile dysfunction)   . Elevated PSA   . Gout   . Gross hematuria   . Hypertension   . Hypertension   . Kidney stone   . Thyroid  disease   . Urinary frequency     Surgical History: Past Surgical History:  Procedure Laterality Date  . none      Home Medications:  Allergies as of 06/21/2016   No Known Allergies     Medication List       Accurate as of 06/21/16  9:02 AM. Always use your most recent med list.          allopurinol 100 MG tablet Commonly known as:  ZYLOPRIM Take 100 mg by mouth daily.   metoprolol succinate 25 MG 24 hr tablet Commonly known as:  TOPROL-XL Take 25 mg by mouth daily.   sildenafil 20 MG tablet Commonly known as:  REVATIO TAKE 2-5 TABLETS BY MOUTH AS NEEDED FOR SEXUAL ACTIVITY   SYNTHROID 50 MCG tablet Generic drug:  levothyroxine Take 50 mcg by mouth every morning.   tamsulosin 0.4 MG Caps capsule Commonly known as:  FLOMAX Take 1 capsule (0.4 mg total) by mouth daily.       Allergies: No Known Allergies  Family History: Family History  Problem Relation Age of Onset  . Kidney cancer Neg Hx   . Kidney disease Neg Hx   . Prostate cancer Neg Hx      Social History:  reports that he has never smoked. He has never used smokeless tobacco. His alcohol and drug histories are not on file.  ROS: UROLOGY Frequent Urination?: No Hard to postpone urination?: No Burning/pain with urination?: No Get up at night to urinate?: No Leakage of urine?: No Urine stream starts and stops?: No Trouble starting stream?: No Do you have to strain to urinate?: No Blood in urine?: No Urinary tract infection?: No Sexually transmitted disease?: No Injury to kidneys or bladder?: No Painful intercourse?: No Weak stream?: No Erection problems?: No Penile pain?: No  Gastrointestinal Nausea?: No Vomiting?: No Indigestion/heartburn?: No Diarrhea?: No Constipation?: No  Constitutional Fever: No Night sweats?: No Weight loss?: No Fatigue?: No  Skin Skin rash/lesions?: No Itching?: No  Eyes Blurred vision?: No Double vision?: No  Ears/Nose/Throat Sore throat?: No Sinus problems?: No  Hematologic/Lymphatic Swollen glands?: No Easy bruising?: No  Cardiovascular Leg swelling?: No Chest pain?: No  Respiratory Cough?: No Shortness of breath?: No  Endocrine Excessive thirst?: No  Musculoskeletal Back pain?: No Joint pain?: No  Neurological Headaches?: No Dizziness?: No  Psychologic Depression?: No Anxiety?: No  Physical Exam: BP 135/76   Pulse 74   Ht 5\' 11"  (1.803 m)   Wt 204 lb (92.5 kg)   BMI 28.45 kg/m   GU: Patient with uncircumcised  phallus. Foreskin easily retracted  Urethral meatus is patent.  No penile discharge. Mild balanitis is present.  Scrotum without lesions, cysts, rashes and/or edema.  Testicles are located scrotally bilaterally. No masses are appreciated in the testicles. Left and right epididymis are normal. Rectal: Patient with  normal sphincter tone. Perineum without scarring or rashes. No rectal masses are appreciated. Prostate is approximately 50 grams, no nodules are appreciated. Seminal  vesicles are normal. Of note, his right hip is bruised from a fall.    Laboratory Data: Previous PSA's:    3.2 ng/mL on 12/18/2012    3.9 ng/mL on 06/22/2013    3.5 ng/mL on 12/21/2013    3.9 ng/mL on 06/24/2014  Results for orders placed or performed in visit on 06/15/16  PSA  Result Value Ref Range   Prostate Specific Ag, Serum 4.6 (H) 0.0 - 4.0 ng/mL     Assessment & Plan:  1. BPH with LUTS  - IPSS score is 0/0, it is stable  - Continue conservative management, avoiding bladder irritants and timed voiding's  - Continue tamsulosin 0.4 mg daily; refills given   - RTC in 6 months for IPSS, PSA and exam   2. Erectile dysfunction:   Patient's SHIM score is 5.  He finds the Cialis effective, but cost prohibitive.  His girlfriend is not interested in sexual activity at this time.  3. Elevated PSA:   He underwent a biopsy on 11/10/2006 for a PSA of 26.9 ng/mL with negative results. Current PSA is 4.6.  We will continue to monitor closely and will see him in 6 months for a PSA and DRE.    4. Nephrolithiasis:   Patient does not want any further work up for his stones at this time.     Return in about 6 months (around 12/19/2016) for IPSS, PSA and exam.  Michiel CowboySHANNON Tishie Altmann, [facility]  [facility] [address], [address] [place], [place] [address] [phone]

## 2016-06-21 ENCOUNTER — Encounter: Payer: Self-pay | Admitting: Urology

## 2016-06-21 ENCOUNTER — Ambulatory Visit: Payer: Medicare Other | Admitting: Urology

## 2016-06-21 ENCOUNTER — Ambulatory Visit (INDEPENDENT_AMBULATORY_CARE_PROVIDER_SITE_OTHER): Payer: Medicare Other | Admitting: Urology

## 2016-06-21 VITALS — BP 135/76 | HR 74 | Ht 71.0 in | Wt 204.0 lb

## 2016-06-21 DIAGNOSIS — N401 Enlarged prostate with lower urinary tract symptoms: Secondary | ICD-10-CM

## 2016-06-21 DIAGNOSIS — N138 Other obstructive and reflux uropathy: Secondary | ICD-10-CM | POA: Diagnosis not present

## 2016-06-21 DIAGNOSIS — N529 Male erectile dysfunction, unspecified: Secondary | ICD-10-CM

## 2016-06-21 DIAGNOSIS — R972 Elevated prostate specific antigen [PSA]: Secondary | ICD-10-CM | POA: Diagnosis not present

## 2016-06-21 DIAGNOSIS — N200 Calculus of kidney: Secondary | ICD-10-CM

## 2016-06-21 MED ORDER — TAMSULOSIN HCL 0.4 MG PO CAPS: 0.4000 mg | ORAL_CAPSULE | Freq: Every day | ORAL | 4 refills | Status: DC

## 2016-07-09 DIAGNOSIS — E042 Nontoxic multinodular goiter: Secondary | ICD-10-CM

## 2016-07-09 HISTORY — DX: Nontoxic multinodular goiter: E04.2

## 2016-08-20 DIAGNOSIS — I10 Essential (primary) hypertension: Secondary | ICD-10-CM | POA: Insufficient documentation

## 2016-08-20 DIAGNOSIS — N200 Calculus of kidney: Secondary | ICD-10-CM | POA: Insufficient documentation

## 2016-08-20 DIAGNOSIS — M1A00X0 Idiopathic chronic gout, unspecified site, without tophus (tophi): Secondary | ICD-10-CM | POA: Insufficient documentation

## 2016-10-08 ENCOUNTER — Other Ambulatory Visit: Payer: Self-pay | Admitting: Urology

## 2016-10-08 DIAGNOSIS — N138 Other obstructive and reflux uropathy: Secondary | ICD-10-CM

## 2016-10-08 DIAGNOSIS — N401 Enlarged prostate with lower urinary tract symptoms: Principal | ICD-10-CM

## 2016-12-13 ENCOUNTER — Other Ambulatory Visit: Payer: Medicare Other

## 2016-12-15 ENCOUNTER — Other Ambulatory Visit: Payer: Medicare Other

## 2016-12-15 DIAGNOSIS — R972 Elevated prostate specific antigen [PSA]: Secondary | ICD-10-CM

## 2016-12-16 LAB — PSA: PROSTATE SPECIFIC AG, SERUM: 3 ng/mL (ref 0.0–4.0)

## 2016-12-19 NOTE — Progress Notes (Signed)
8:34 AM   Henry Shepard 04/17/1941 [id number]  Referring provider: Alan Mulder, MD [address] [place], [place] [address]  Chief Complaint  Patient presents with  . Benign Prostatic Hypertrophy    6 month follow up   . Erectile Dysfunction    HPI: Henry Shepard is a 76 year old Caucasian male with ED, a history of elevated PSA and BPH with LUTS who presents today for 6 months follow up.    Erectile dysfunction His previous SHIM score was 5.  He has been having difficulty with erections for over ten years .   His major complaint is getting and keeping an erection.  His libido is good.   His risk factors for ED are hypertension and hypothyroidism.  He denies any painful erections or curvatures with his erections.   He has tried Viagra and Cialis in the past.  He is requesting a refill on sildenafil today.    History of elevated PSA He underwent a biopsy on 11/10/2006 for a PSA of 26.9 ng/mL with negative results.  His most recent PSA is 3.0 ng/mL on 12/15/2016.  Marland Kitchen    BPH with LUTS His IPSS score today is 1, which is mild lower urinary tract symptomatology. He is delighted with his quality life due to his urinary symptoms.  His previous I PSS score was 0/0.  He has no complaints today.  He denies any dysuria, hematuria or suprapubic pain.  He currently taking tamsulosin.  He also denies any recent fevers, chills, nausea or vomiting.  He does not have a family history of PCa.      IPSS    Row Name 12/20/16 0800         International Prostate Symptom Score   How often have you had the sensation of not emptying your bladder? Not at All     How often have you had to urinate less than every two hours? Not at All     How often have you found you stopped and started again several times when you urinated? Not at All     How often have you found it difficult to postpone urination? Not at All     How often have you had a weak urinary stream? Not at All     How often have you had to  strain to start urination? Not at All     How many times did you typically get up at night to urinate? 1 Time     Total IPSS Score 1       Quality of Life due to urinary symptoms   If you were to spend the rest of your life with your urinary condition just the way it is now how would you feel about that? Delighted        Score:  1-7 Mild 8-19 Moderate 20-35 Severe   Nephrolithiasis Patient continues to pass stones at a frequency of one a year.  He does not have hematuria.  He has not had fevers, chills, nausea or vomiting.    PMH: Past Medical History:  Diagnosis Date  . BPH (benign prostatic hyperplasia)   . ED (erectile dysfunction)   . Elevated PSA   . Gout   . Gross hematuria   . Hypertension   . Hypertension   . Kidney stone   . Thyroid disease   . Urinary frequency     Surgical History: Past Surgical History:  Procedure Laterality Date  . none  Home Medications:  Allergies as of 12/20/2016   No Known Allergies     Medication List       Accurate as of 12/20/16  8:34 AM. Always use your most recent med list.          allopurinol 100 MG tablet Commonly known as:  ZYLOPRIM Take 100 mg by mouth daily.   lisinopril 10 MG tablet Commonly known as:  PRINIVIL,ZESTRIL Take by mouth.   metoprolol succinate 25 MG 24 hr tablet Commonly known as:  TOPROL-XL Take 25 mg by mouth daily.   omeprazole 20 MG capsule Commonly known as:  PRILOSEC Take by mouth.   sildenafil 20 MG tablet Commonly known as:  REVATIO TAKE 2-5 TABLETS BY MOUTH AS NEEDED FOR SEXUAL ACTIVITY   SYNTHROID 50 MCG tablet Generic drug:  levothyroxine Take 50 mcg by mouth every morning.   tamsulosin 0.4 MG Caps capsule Commonly known as:  FLOMAX Take 1 capsule (0.4 mg total) by mouth daily.       Allergies: No Known Allergies  Family History: Family History  Problem Relation Age of Onset  . Kidney cancer Neg Hx   . Kidney disease Neg Hx   . Prostate cancer Neg Hx   .  Bladder Cancer Neg Hx     Social History:  reports that he has never smoked. He has never used smokeless tobacco. He reports that he does not drink alcohol or use drugs.  ROS: UROLOGY Frequent Urination?: No Hard to postpone urination?: No Burning/pain with urination?: No Get up at night to urinate?: No Leakage of urine?: No Urine stream starts and stops?: No Trouble starting stream?: No Do you have to strain to urinate?: No Blood in urine?: No Urinary tract infection?: No Sexually transmitted disease?: No Injury to kidneys or bladder?: No Painful intercourse?: No Weak stream?: No Erection problems?: Yes Penile pain?: No  Gastrointestinal Nausea?: No Vomiting?: No Indigestion/heartburn?: No Diarrhea?: No Constipation?: No  Constitutional Fever: No Night sweats?: No Weight loss?: No Fatigue?: No  Skin Skin rash/lesions?: No Itching?: No  Eyes Blurred vision?: No Double vision?: No  Ears/Nose/Throat Sore throat?: No Sinus problems?: No  Hematologic/Lymphatic Swollen glands?: No Easy bruising?: No  Cardiovascular Leg swelling?: No Chest pain?: No  Respiratory Cough?: No Shortness of breath?: No  Endocrine Excessive thirst?: No  Musculoskeletal Back pain?: No Joint pain?: No  Neurological Headaches?: No Dizziness?: No  Psychologic Depression?: No Anxiety?: No  Physical Exam: BP 110/67   Pulse 79   Ht 5\' 11"  (1.803 m)   Wt 200 lb 1.6 oz (90.8 kg)   BMI 27.91 kg/m   GU: Patient with uncircumcised  phallus. Foreskin easily retracted  Urethral meatus is patent.  No penile discharge. Mild balanitis is present.  Scrotum without lesions, cysts, rashes and/or edema.  Testicles are located scrotally bilaterally. No masses are appreciated in the testicles. Left and right epididymis are normal. Rectal: Patient with  normal sphincter tone. Perineum without scarring or rashes. No rectal masses are appreciated. Prostate is approximately 50 grams, no  nodules are appreciated. Seminal vesicles are normal.   Laboratory Data: Previous PSA's:    3.2 ng/mL on 12/18/2012    3.9 ng/mL on 06/22/2013    3.5 ng/mL on 12/21/2013    3.9 ng/mL on 06/24/2014  Results for orders placed or performed in visit on 12/15/16  PSA  Result Value Ref Range   Prostate Specific Ag, Serum 3.0 0.0 - 4.0 ng/mL   I have reviewed the labs  Assessment & Plan:    1. BPH with LUTS  - IPSS score is 1/0, it is stable  - Continue conservative management, avoiding bladder irritants and timed voiding's  - Continue tamsulosin 0.4 mg daily; refills given   - RTC in 12 months for IPSS, PSA and exam   2. Erectile dysfunction:   Patient's SHIM score is 5.  Sildenafil 20 mg, 3 to 5 tablets two hours prior to intercourse on an empty stomach, # 50; he is warned not to take medications that contain nitrates.  I also advised him of the side effects, such as: headache, flushing, dyspepsia, abnormal vision, nasal congestion, back pain, myalgia, nausea, dizziness, and rash.  Sent to pharmacy.  RTC in 12 months for SHIM and exam.    3. History of elevated PSA:   He underwent a biopsy on 11/10/2006 for a PSA of 26.9 ng/mL with negative results. Current PSA is 3.0.  We will continue to monitor closely and will see him in 12 months for a PSA and DRE.    4. Nephrolithiasis:   Patient does not want any further work up for his stones at this time.     Return in about 1 year (around 12/20/2017) for IPSS, SHIM, PSA and exam.  Michiel Cowboy, [facility]  [facility] [address] [place], [place] [address] [phone]

## 2016-12-20 ENCOUNTER — Encounter: Payer: Self-pay | Admitting: Urology

## 2016-12-20 ENCOUNTER — Ambulatory Visit (INDEPENDENT_AMBULATORY_CARE_PROVIDER_SITE_OTHER): Payer: Medicare Other | Admitting: Urology

## 2016-12-20 VITALS — BP 110/67 | HR 79 | Ht 71.0 in | Wt 200.1 lb

## 2016-12-20 DIAGNOSIS — N200 Calculus of kidney: Secondary | ICD-10-CM

## 2016-12-20 DIAGNOSIS — Z87898 Personal history of other specified conditions: Secondary | ICD-10-CM | POA: Diagnosis not present

## 2016-12-20 DIAGNOSIS — N138 Other obstructive and reflux uropathy: Secondary | ICD-10-CM | POA: Diagnosis not present

## 2016-12-20 DIAGNOSIS — N401 Enlarged prostate with lower urinary tract symptoms: Secondary | ICD-10-CM | POA: Diagnosis not present

## 2016-12-20 DIAGNOSIS — N529 Male erectile dysfunction, unspecified: Secondary | ICD-10-CM | POA: Diagnosis not present

## 2016-12-20 MED ORDER — TAMSULOSIN HCL 0.4 MG PO CAPS: 0.4000 mg | ORAL_CAPSULE | Freq: Every day | ORAL | 3 refills | Status: DC

## 2016-12-20 MED ORDER — SILDENAFIL CITRATE 20 MG PO TABS: ORAL_TABLET | ORAL | 3 refills | Status: DC

## 2017-01-12 ENCOUNTER — Other Ambulatory Visit: Payer: Self-pay | Admitting: Urology

## 2017-01-12 DIAGNOSIS — N529 Male erectile dysfunction, unspecified: Secondary | ICD-10-CM

## 2017-04-11 DIAGNOSIS — K222 Esophageal obstruction: Secondary | ICD-10-CM

## 2017-04-11 DIAGNOSIS — R1314 Dysphagia, pharyngoesophageal phase: Secondary | ICD-10-CM | POA: Insufficient documentation

## 2017-04-11 DIAGNOSIS — K219 Gastro-esophageal reflux disease without esophagitis: Secondary | ICD-10-CM | POA: Insufficient documentation

## 2017-04-11 HISTORY — DX: Esophageal obstruction: K22.2

## 2017-10-17 ENCOUNTER — Other Ambulatory Visit: Payer: Self-pay | Admitting: Urology

## 2017-10-17 DIAGNOSIS — N401 Enlarged prostate with lower urinary tract symptoms: Principal | ICD-10-CM

## 2017-10-17 DIAGNOSIS — N138 Other obstructive and reflux uropathy: Secondary | ICD-10-CM

## 2017-11-24 DIAGNOSIS — M160 Bilateral primary osteoarthritis of hip: Secondary | ICD-10-CM

## 2017-11-24 DIAGNOSIS — M7061 Trochanteric bursitis, right hip: Secondary | ICD-10-CM | POA: Insufficient documentation

## 2017-11-24 HISTORY — DX: Bilateral primary osteoarthritis of hip: M16.0

## 2017-11-24 HISTORY — DX: Trochanteric bursitis, right hip: M70.61

## 2017-12-20 ENCOUNTER — Ambulatory Visit: Payer: Medicare Other | Admitting: Urology

## 2017-12-25 NOTE — Progress Notes (Signed)
10:45 AM   Henry Shepard 12/17/1940 [id number]  Referring provider: Alan MulderMorayati, Shamil J, MD [address] [place], [place] [id number]  Chief Complaint  Patient presents with  . Benign Prostatic Hypertrophy    HPI: Henry Shepard is a 77 year old Caucasian male with ED, a history of elevated PSA and BPH with LUTS who presents today for 12 months follow up.    Erectile dysfunction His SHIM score today is 8, which is moderate ED.  His previous SHIM score was 5.  He has been having difficulty with erections for over ten years .   His major complaint is getting and keeping an erection.  His libido is good.   His risk factors for ED are hypertension and hypothyroidism.  He denies any painful erections or curvatures with his erections.   He has tried Viagra and Cialis in the past.  He is requesting a refill on sildenafil today.   SHIM    Row Name 12/27/17 1018         SHIM: Over the last 6 months:   How do you rate your confidence that you could get and keep an erection?  Very Low     When you had erections with sexual stimulation, how often were your erections hard enough for penetration (entering your partner)?  Almost Never or Never     During sexual intercourse, how often were you able to maintain your erection after you had penetrated (entered) your partner?  A Few Times (much less than half the time)     During sexual intercourse, how difficult was it to maintain your erection to completion of intercourse?  Very Difficult     When you attempted sexual intercourse, how often was it satisfactory for you?  A Few Times (much less than half the time)       SHIM Total Score   SHIM  8        Score: 1-7 Severe ED 8-11 Moderate ED 12-16 Mild-Moderate ED 17-21 Mild ED 22-25 No ED  History of elevated PSA PSA Trend  26.9 in 10/2006  bx negative  3.2 ng/mL on 12/18/2012     3.9 ng/mL on 06/22/2013     3.5 ng/mL on 12/21/2013     3.9 ng/mL on 06/24/2014  3.5 in 06/2015  3.1 in  11/2015  4.6 in 05/2016  3.0 in 11/2016    BPH with LUTS His IPSS score today is 0, which is no lower urinary tract symptomatology.  He is delighted with his quality life due to his urinary symptoms.  His previous I PSS score was 1/0.  He has no complaints today.  He denies any dysuria, hematuria or suprapubic pain.  He currently taking tamsulosin.  He also denies any recent fevers, chills, nausea or vomiting.  He does not have a family history of PCa. IPSS    Row Name 12/27/17 1000         International Prostate Symptom Score   How often have you had the sensation of not emptying your bladder?  Not at All     How often have you had to urinate less than every two hours?  Not at All     How often have you found you stopped and started again several times when you urinated?  Not at All     How often have you found it difficult to postpone urination?  Not at All     How often have you had a  weak urinary stream?  Not at All     How often have you had to strain to start urination?  Not at All     How many times did you typically get up at night to urinate?  None     Total IPSS Score  0       Quality of Life due to urinary symptoms   If you were to spend the rest of your life with your urinary condition just the way it is now how would you feel about that?  Delighted        Score:  1-7 Mild 8-19 Moderate 20-35 Severe  Nephrolithiasis Patient continues to pass stones at a frequency of one a year.  He does not have hematuria.  He has not had fevers, chills, nausea or vomiting.     PMH: Past Medical History:  Diagnosis Date  . BPH (benign prostatic hyperplasia)   . ED (erectile dysfunction)   . Elevated PSA   . Gout   . Gross hematuria   . Hypertension   . Hypertension   . Kidney stone   . Thyroid disease   . Urinary frequency     Surgical History: Past Surgical History:  Procedure Laterality Date  . none      Home Medications:  Allergies as of 12/27/2017   No Known  Allergies     Medication List        Accurate as of 12/27/17 10:45 AM. Always use your most recent med list.          allopurinol 100 MG tablet Commonly known as:  ZYLOPRIM Take 100 mg by mouth daily.   lisinopril 10 MG tablet Commonly known as:  PRINIVIL,ZESTRIL Take by mouth.   metoprolol succinate 25 MG 24 hr tablet Commonly known as:  TOPROL-XL Take 25 mg by mouth daily.   omeprazole 20 MG capsule Commonly known as:  PRILOSEC Take by mouth.   sildenafil 20 MG tablet Commonly known as:  REVATIO TAKE 2-5 TABLETS BY MOUTH AS NEEDED FOR SEXUAL ACTIVITY   sildenafil 20 MG tablet Commonly known as:  REVATIO TAKE 2 TO 5 TABLETS AS NEEDED FOR SEXUALACTIVITY   SYNTHROID 50 MCG tablet Generic drug:  levothyroxine Take 50 mcg by mouth every morning.   tamsulosin 0.4 MG Caps capsule Commonly known as:  FLOMAX TAKE 1 CAPSULE EVERY DAY       Allergies: No Known Allergies  Family History: Family History  Problem Relation Age of Onset  . Kidney cancer Neg Hx   . Kidney disease Neg Hx   . Prostate cancer Neg Hx   . Bladder Cancer Neg Hx     Social History:  reports that he has never smoked. He has never used smokeless tobacco. He reports that he does not drink alcohol or use drugs.  ROS: UROLOGY Frequent Urination?: No Hard to postpone urination?: No Burning/pain with urination?: No Get up at night to urinate?: No Leakage of urine?: No Urine stream starts and stops?: No Trouble starting stream?: No Do you have to strain to urinate?: No Blood in urine?: No Urinary tract infection?: No Sexually transmitted disease?: No Injury to kidneys or bladder?: No Painful intercourse?: No Weak stream?: No Erection problems?: No Penile pain?: No  Gastrointestinal Nausea?: No Vomiting?: No Indigestion/heartburn?: No Diarrhea?: No Constipation?: No  Constitutional Fever: No Night sweats?: No Weight loss?: No Fatigue?: No  Skin Skin rash/lesions?:  No Itching?: No  Eyes Blurred vision?: No Double vision?: No  Ears/Nose/Throat Sore throat?: No Sinus problems?: No  Hematologic/Lymphatic Swollen glands?: No Easy bruising?: No  Cardiovascular Leg swelling?: No Chest pain?: No  Respiratory Cough?: No Shortness of breath?: No  Endocrine Excessive thirst?: No  Musculoskeletal Back pain?: No Joint pain?: No  Neurological Headaches?: No Dizziness?: No  Psychologic Depression?: No Anxiety?: No  Physical Exam: BP (!) 143/80   Pulse 84   Wt 204 lb 12.8 oz (92.9 kg)   BMI 28.56 kg/m   Constitutional: Well nourished. Alert and oriented, No acute distress. HEENT: [place] AT, moist mucus membranes. Trachea midline, no masses. Cardiovascular: No clubbing, cyanosis, or edema. Respiratory: Normal respiratory effort, no increased work of breathing. GI: Abdomen is soft, non tender, non distended, no abdominal masses. Liver and spleen not palpable.  No hernias appreciated.  Stool sample for occult testing is not indicated.   GU: No CVA tenderness.  No bladder fullness or masses.  Patient with uncircumcised phallus. Foreskin easily retracted  Urethral meatus is patent.  No penile discharge. No penile lesions or rashes. Erythema noted on the dorsal side of glans.  Scrotum without lesions, cysts, rashes and/or edema.  Testicles are located scrotally bilaterally. No masses are appreciated in the testicles. Left and right epididymis are normal. Rectal: Patient with  normal sphincter tone. Anus and perineum without scarring or rashes. No rectal masses are appreciated. Prostate is approximately 50 grams, no nodules are appreciated. Seminal vesicles are normal. Skin: No rashes, bruises or suspicious lesions. Lymph: No cervical or inguinal adenopathy. Neurologic: Grossly intact, no focal deficits, moving all 4 extremities. Psychiatric: Normal mood and affect.    Laboratory Data: See HPI I have reviewed the labs  Assessment & Plan:     1. BPH with LUTS IPSS score is 0/0, it is improved Continue conservative management, avoiding bladder irritants and timed voiding's Continue tamsulosin 0.4 mg daily; refills given  RTC in 12 months for IPSS, PSA and exam   2. Erectile dysfunction SHIM is 8, it improved Sildenafil 20 mg, 3 to 5 tablets two hours prior to intercourse on an empty stomach, # 50; he is warned not to take medications that contain nitrates.  I also advised him of the side effects, such as: headache, flushing, dyspepsia, abnormal vision, nasal congestion, back pain, myalgia, nausea, dizziness, and rash.  Sent to pharmacy.   RTC in 12 months for SHIM and exam.    3. History of elevated PSA:   He underwent a biopsy on 11/10/2006 for a PSA of 26.9 ng/mL with negative results. Current PSA is 3.0.  We will continue to monitor closely and will see him in 12 months for a PSA and DRE.    4. Nephrolithiasis:   Patient does not want any further work up for his stones at this time.    5. Balanitis Prescribed Nystatin cream bid Patient to call if not resolved as we will need to biopsy to rule out cancer   Return in about 1 year (around 12/28/2018) for IPSS, PSA and exam.  Michiel Cowboy, [facility]  [facility] [address] [place], [place] [address] [phone]

## 2017-12-27 ENCOUNTER — Ambulatory Visit (INDEPENDENT_AMBULATORY_CARE_PROVIDER_SITE_OTHER): Payer: Medicare Other | Admitting: Urology

## 2017-12-27 ENCOUNTER — Encounter: Payer: Self-pay | Admitting: Urology

## 2017-12-27 ENCOUNTER — Other Ambulatory Visit: Payer: Self-pay

## 2017-12-27 VITALS — BP 143/80 | HR 84 | Wt 204.8 lb

## 2017-12-27 DIAGNOSIS — N529 Male erectile dysfunction, unspecified: Secondary | ICD-10-CM | POA: Diagnosis not present

## 2017-12-27 DIAGNOSIS — Z87898 Personal history of other specified conditions: Secondary | ICD-10-CM

## 2017-12-27 DIAGNOSIS — N401 Enlarged prostate with lower urinary tract symptoms: Secondary | ICD-10-CM | POA: Diagnosis not present

## 2017-12-27 DIAGNOSIS — N200 Calculus of kidney: Secondary | ICD-10-CM

## 2017-12-27 DIAGNOSIS — N138 Other obstructive and reflux uropathy: Secondary | ICD-10-CM

## 2017-12-27 MED ORDER — NYSTATIN 100000 UNIT/GM EX CREA: 1.0000 "application " | TOPICAL_CREAM | Freq: Two times a day (BID) | CUTANEOUS | 0 refills | Status: DC

## 2017-12-27 MED ORDER — TAMSULOSIN HCL 0.4 MG PO CAPS: 0.4000 mg | ORAL_CAPSULE | Freq: Every day | ORAL | 3 refills | Status: DC

## 2017-12-27 MED ORDER — SILDENAFIL CITRATE 20 MG PO TABS: ORAL_TABLET | ORAL | 3 refills | Status: DC

## 2017-12-28 ENCOUNTER — Telehealth: Payer: Self-pay

## 2017-12-28 LAB — PSA: PROSTATE SPECIFIC AG, SERUM: 3 ng/mL (ref 0.0–4.0)

## 2017-12-28 NOTE — Telephone Encounter (Signed)
-----   Message from Harle BattiestShannon A McGowan, PA-C sent at 12/28/2017  7:20 AM EDT ----- Please let Mr. Ralene CorkBame know that his PSA is stable at 3.0.  We will see him next year.

## 2017-12-28 NOTE — Telephone Encounter (Signed)
Pt inforned

## 2018-03-28 ENCOUNTER — Telehealth: Payer: Self-pay | Admitting: Family Medicine

## 2018-03-28 NOTE — Telephone Encounter (Signed)
Patient called stating he is almost out of the Nystatin cream and he also is questioning burning under his penis near his testicles. He is wanting to know if there is something he needs to do. He states at last visit you mentioned if the rash does not go away he may need a blood test or something. He states the rash is about 90 percent better.

## 2018-03-28 NOTE — Telephone Encounter (Signed)
I stated that we may need to biopsy the rash.  He should come in for an exam.

## 2018-03-28 NOTE — Telephone Encounter (Signed)
Patient Notified, appointment made

## 2018-04-04 ENCOUNTER — Encounter: Payer: Self-pay | Admitting: Urology

## 2018-04-04 ENCOUNTER — Ambulatory Visit (INDEPENDENT_AMBULATORY_CARE_PROVIDER_SITE_OTHER): Payer: Medicare Other | Admitting: Urology

## 2018-04-04 VITALS — BP 132/85 | HR 83 | Ht 71.0 in | Wt 205.3 lb

## 2018-04-04 DIAGNOSIS — N481 Balanitis: Secondary | ICD-10-CM

## 2018-04-04 DIAGNOSIS — N5319 Other ejaculatory dysfunction: Secondary | ICD-10-CM | POA: Diagnosis not present

## 2018-04-04 NOTE — Progress Notes (Signed)
11:01 AM   Lizzie Cokley [place] Aug 11, 1940 [id number]  Referring provider: Leslye Peer, PA [address] [facility] [place], [place] [address]  Chief Complaint  Patient presents with  . Follow-up    HPI: Mr. Jue is a 77 year old Caucasian male with a penile rash who presents today for follow up.  He has been using the cream and has not seen improvement after using the cream.  He is also having issues with the inability to ejaculate.    He states he noted the issues with ejaculation since his colonoscopy.     PMH: Past Medical History:  Diagnosis Date  . BPH (benign prostatic hyperplasia)   . ED (erectile dysfunction)   . Elevated PSA   . Gout   . Gross hematuria   . Hypertension   . Hypertension   . Kidney stone   . Thyroid disease   . Urinary frequency     Surgical History: Past Surgical History:  Procedure Laterality Date  . none      Home Medications:  Allergies as of 04/04/2018   No Known Allergies     Medication List        Accurate as of 04/04/18 11:01 AM. Always use your most recent med list.          allopurinol 100 MG tablet Commonly known as:  ZYLOPRIM Take 100 mg by mouth daily.   lisinopril 10 MG tablet Commonly known as:  PRINIVIL,ZESTRIL Take by mouth.   metoprolol succinate 25 MG 24 hr tablet Commonly known as:  TOPROL-XL Take 25 mg by mouth daily.   nystatin cream Commonly known as:  MYCOSTATIN Apply 1 application topically 2 (two) times daily.   omeprazole 20 MG capsule Commonly known as:  PRILOSEC Take by mouth.   sildenafil 20 MG tablet Commonly known as:  REVATIO TAKE 2 TO 5 TABLETS AS NEEDED FOR SEXUALACTIVITY   sildenafil 20 MG tablet Commonly known as:  REVATIO TAKE 2-5 TABLETS BY MOUTH AS NEEDED FOR SEXUAL ACTIVITY   SYNTHROID 50 MCG tablet Generic drug:  levothyroxine Take 50 mcg by mouth every morning.   tamsulosin 0.4 MG Caps capsule Commonly known as:  FLOMAX Take 1 capsule  (0.4 mg total) by mouth daily.       Allergies: No Known Allergies  Family History: Family History  Problem Relation Age of Onset  . Kidney cancer Neg Hx   . Kidney disease Neg Hx   . Prostate cancer Neg Hx   . Bladder Cancer Neg Hx     Social History:  reports that he has never smoked. He has never used smokeless tobacco. He reports that he does not drink alcohol or use drugs.  ROS: UROLOGY Frequent Urination?: No Hard to postpone urination?: No Burning/pain with urination?: Yes Get up at night to urinate?: No Leakage of urine?: No Urine stream starts and stops?: No Trouble starting stream?: No Do you have to strain to urinate?: No Blood in urine?: No Urinary tract infection?: No Sexually transmitted disease?: No Injury to kidneys or bladder?: No Painful intercourse?: No Weak stream?: No Erection problems?: Yes Penile pain?: No  Gastrointestinal Nausea?: No Vomiting?: No Indigestion/heartburn?: No Diarrhea?: No Constipation?: No  Constitutional Night sweats?: No Weight loss?: No Fatigue?: No  Skin Skin rash/lesions?: No Itching?: No  Eyes Blurred vision?: No Double vision?: No  Ears/Nose/Throat Sore throat?: No Sinus problems?: No  Hematologic/Lymphatic Swollen glands?: No  Cardiovascular Leg swelling?: No Chest pain?:  No  Respiratory Cough?: No Shortness of breath?: No  Endocrine Excessive thirst?: No  Musculoskeletal Back pain?: No Joint pain?: No  Neurological Headaches?: No Dizziness?: No  Psychologic Depression?: No Anxiety?: No  Physical Exam: BP 132/85 (BP Location: Left Arm, Patient Position: Sitting, Cuff Size: Normal)   Pulse 83   Ht 5\' 11"  (1.803 m)   Wt 205 lb 4.8 oz (93.1 kg)   BMI 28.63 kg/m   Constitutional: Well nourished. Alert and oriented, No acute distress. GU: No CVA tenderness.  No bladder fullness or masses.  Patient with uncircumcised phallus.  Foreskin easily retracted  Urethral meatus is patent.   No penile discharge. No penile lesions or rashes.  Erythema noted on the coronal surface of the glans.  It is unchanged from previous exam.    Laboratory Data: n/a Assessment & Plan:    1. Balanitis No improvement seen after the use of nystatin cream  Patient has an appointment with his dermatologist in 2 weeks and I have asked him to obtain a second opinion regarding the rash on his penis before proceeding with a biopsy He will contact me with a dermatologist opinion   2.  Ejaculatory issues Explained to the patient that this is more likely due to the tamsulosin and his enlarged prostate versus undergoing a colonoscopy Reassured the patient that this is not a serious health issue  Return for follow up after derm appt. Marland Kitchen  Michiel Cowboy, PA-C  [facility] [address] [place], [place] [address] [phone]  I spent 15 minutes with the patient discussing obtaining a second opinion with his dermatologist regarding the penile rash before subjecting him to a penile biopsy as they are trained in diseases of the skin  I spent 15 with this patient of which greater than 50% was spent in counseling and coordination of care with the patient.

## 2018-04-11 ENCOUNTER — Telehealth: Payer: Self-pay | Admitting: Urology

## 2018-04-11 NOTE — Telephone Encounter (Signed)
Patient said that you wanted him to see a dermatologist and he said he went to see on and now wants to talk to you about it. He wants you to call him   Henry Shepard

## 2018-04-12 ENCOUNTER — Telehealth: Payer: Self-pay | Admitting: Urology

## 2018-04-12 NOTE — Telephone Encounter (Signed)
I left a message for Mr. Henry Shepard to return my phone call.  I need to know what the dermatologist said concerning the rash on the head of his penis.  If we can get the notes faxed to us as well, that would be great.

## 2018-04-12 NOTE — Telephone Encounter (Signed)
Spoke with pt and he states he has already spoken with Carollee HerterShannon, looked in Epic and saw Shannon's note. Per pt he did want to mention the cream he is using is hydrocortisone 0.01% BID

## 2018-04-12 NOTE — Telephone Encounter (Signed)
Mr. Henry Shepard states that the dermatologist thinks that his penile rash is due to a ruptured blood vessel.  He is to the use the cream twice daily for two weeks and then return for recheck.  If no improvement, they are going to pursue biopsy.

## 2018-06-12 ENCOUNTER — Telehealth: Payer: Self-pay | Admitting: Urology

## 2018-06-12 NOTE — Telephone Encounter (Signed)
I left a message for Henry Shepard to call me back.  I need to know what his dermatologist decided regarding his penile rash and I would like the office notes faxed to [place].

## 2018-06-12 NOTE — Telephone Encounter (Signed)
Patient says he did see his dermatologist.  They prescribed some cream that did not help.  He went to [facility].  Patients daughter is a Administrator, [employer] and gave the patient a cream that is helping.  Patient is not sure of the name of the cream.

## 2018-12-16 ENCOUNTER — Other Ambulatory Visit: Payer: Self-pay | Admitting: Urology

## 2018-12-22 ENCOUNTER — Other Ambulatory Visit: Payer: Self-pay

## 2018-12-22 DIAGNOSIS — N138 Other obstructive and reflux uropathy: Secondary | ICD-10-CM

## 2018-12-22 DIAGNOSIS — Z87898 Personal history of other specified conditions: Secondary | ICD-10-CM

## 2018-12-22 DIAGNOSIS — Z125 Encounter for screening for malignant neoplasm of prostate: Secondary | ICD-10-CM

## 2018-12-25 ENCOUNTER — Other Ambulatory Visit: Payer: Self-pay

## 2018-12-25 ENCOUNTER — Other Ambulatory Visit: Payer: Medicare Other

## 2018-12-25 DIAGNOSIS — N138 Other obstructive and reflux uropathy: Secondary | ICD-10-CM

## 2018-12-25 NOTE — Progress Notes (Signed)
9:31 AM   Fayrene HelperHarold M Lantz 05-05-1941 [id number]  Referring provider: Leslye PeerWeeks, Jonathan William, PA [address] [facility] [place] [id number]  Chief Complaint  Patient presents with  . Elevated PSA    HPI: Mr. Henry Shepard is a 78 year old male with ED, a history of elevated PSA and BPH with LUTS who presents today for 12 months follow up.    Erectile dysfunction His SHIM score today is 9, which is moderate ED.  His previous SHIM score was 8.  He has been having difficulty with erections for over ten years .   His major complaint is getting and keeping an erection.  His libido is good.   His risk factors for ED are hypertension and hypothyroidism.  He denies any painful erections or curvatures with his erections.   He has tried Viagra and Cialis in the past.  He is requesting a refill on sildenafil today.   SHIM    Row Name 12/26/18 [phone]         SHIM: Over the last 6 months:   How do you rate your confidence that you could get and keep an erection?  Very Low     When you had erections with sexual stimulation, how often were your erections hard enough for penetration (entering your partner)?  A Few Times (much less than half the time)     During sexual intercourse, how often were you able to maintain your erection after you had penetrated (entered) your partner?  A Few Times (much less than half the time)     During sexual intercourse, how difficult was it to maintain your erection to completion of intercourse?  Very Difficult     When you attempted sexual intercourse, how often was it satisfactory for you?  A Few Times (much less than half the time)       SHIM Total Score   SHIM  9        Score: 1-7 Severe ED 8-11 Moderate ED 12-16 Mild-Moderate ED 17-21 Mild ED 22-25 No ED  History of elevated PSA PSA Trend  26.9 in 10/2006  bx negative  3.2 ng/mL on 12/18/2012     3.9 ng/mL on 06/22/2013     3.5 ng/mL on 12/21/2013     3.9 ng/mL on 06/24/2014  3.5  in 06/2015  3.1 in 11/2015  4.6 in 05/2016  3.0 in 11/2016  3.0 in 11/2017    BPH with LUTS His IPSS score today is 0, which is no lower urinary tract symptomatology.  He is delighted with his quality life due to his urinary symptoms.  His previous I PSS score was 0/0.  He has no complaints today. He currently taking tamsulosin.  He also denies any recent fevers, chills, nausea or vomiting.  He does not have a family history of PCa. IPSS    Row Name 12/26/18 0800         International Prostate Symptom Score   How often have you had the sensation of not emptying your bladder?  Not at All     How often have you had to urinate less than every two hours?  Not at All     How often have you found you stopped and started again several times when you urinated?  Not at All     How often have you found it difficult to postpone urination?  Not at All     How often have  you had a weak urinary stream?  Not at All     How often have you had to strain to start urination?  Not at All     How many times did you typically get up at night to urinate?  None     Total IPSS Score  0       Quality of Life due to urinary symptoms   If you were to spend the rest of your life with your urinary condition just the way it is now how would you feel about that?  Delighted        Score:  1-7 Mild 8-19 Moderate 20-35 Severe  Nephrolithiasis Patient has not had a stone episode in one year and one half.  Patient denies any gross hematuria, dysuria or suprapubic/flank pain.  Patient denies any fevers, chills, nausea or vomiting.    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     PMH: Past Medical History:  Diagnosis Date  . BPH (benign prostatic hyperplasia)   . ED (erectile dysfunction)   . Elevated PSA   . Gout   . Gross hematuria   . Hypertension   . Hypertension   . Kidney stone   . Thyroid disease   . Urinary frequency     Surgical History: Past Surgical History:  Procedure Laterality Date  . none       Home Medications:  Allergies as of 12/26/2018   No Known Allergies     Medication List       Accurate as of December 26, 2018  9:31 AM. If you have any questions, ask your nurse or doctor.        allopurinol 100 MG tablet Commonly known as: ZYLOPRIM Take 100 mg by mouth daily.   lisinopril 10 MG tablet Commonly known as: ZESTRIL Take by mouth.   metoprolol succinate 25 MG 24 hr tablet Commonly known as: TOPROL-XL Take 25 mg by mouth daily.   nystatin cream Commonly known as: MYCOSTATIN Apply 1 application topically 2 (two) times daily.   omeprazole 20 MG capsule Commonly known as: PRILOSEC Take by mouth.   sildenafil 20 MG tablet Commonly known as: REVATIO TAKE 2 TO 5 TABLETS EVERY DAY AS NEEDED FOR SEXUAL ACTIVITY   Synthroid 50 MCG tablet Generic drug: levothyroxine Take 50 mcg by mouth every morning.   tamsulosin 0.4 MG Caps capsule Commonly known as: FLOMAX Take 1 capsule (0.4 mg total) by mouth daily.       Allergies: No Known Allergies  Family History: Family History  Problem Relation Age of Onset  . Kidney cancer Neg Hx   . Kidney disease Neg Hx   . Prostate cancer Neg Hx   . Bladder Cancer Neg Hx     Social History:  reports that he has never smoked. He has never used smokeless tobacco. He reports that he does not drink alcohol or use drugs.  ROS: UROLOGY Frequent Urination?: No Hard to postpone urination?: No Burning/pain with urination?: No Get up at night to urinate?: No Leakage of urine?: No Urine stream starts and stops?: No Trouble starting stream?: No Do you have to strain to urinate?: No Blood in urine?: No Urinary tract infection?: No Sexually transmitted disease?: No Injury to kidneys or bladder?: No Painful intercourse?: No Weak stream?: No Erection problems?: Yes Penile pain?: No  Gastrointestinal Nausea?: No Vomiting?: No Indigestion/heartburn?: No Diarrhea?: No Constipation?: No  Constitutional Fever: No  Night sweats?: No Weight loss?: No Fatigue?: No  Skin Skin rash/lesions?:  No Itching?: No  Eyes Blurred vision?: No Double vision?: No  Ears/Nose/Throat Sore throat?: No Sinus problems?: No  Hematologic/Lymphatic Swollen glands?: No Easy bruising?: No  Cardiovascular Leg swelling?: No Chest pain?: No  Respiratory Cough?: No Shortness of breath?: No  Endocrine Excessive thirst?: No  Musculoskeletal Back pain?: No Joint pain?: No  Neurological Headaches?: No Dizziness?: No  Psychologic Depression?: No Anxiety?: No  Physical Exam: BP 122/75 (BP Location: Left Arm, Patient Position: Sitting, Cuff Size: Normal)   Pulse 92   Ht 5\' 11"  (1.803 m)   Wt 197 lb 11.2 oz (89.7 kg)   BMI 27.57 kg/m   Constitutional:  Well nourished. Alert and oriented, No acute distress. HEENT: [place] AT, moist mucus membranes.  Trachea midline, no masses. Cardiovascular: No clubbing, cyanosis, or edema. Respiratory: Normal respiratory effort, no increased work of breathing. GI: Abdomen is soft, non tender, non distended, no abdominal masses. Liver and spleen not palpable.  No hernias appreciated.  Stool sample for occult testing is not indicated.   GU: No CVA tenderness.  No bladder fullness or masses.  Patient with uncircumcised phallus.  Foreskin easily retracted.  A discrete erythematosus and slightly raised plaque with dry adherent scale on the glans.  Urethral meatus is patent.  No penile discharge. No penile lesions or rashes. Scrotum without lesions, cysts, rashes and/or edema.  Testicles are located scrotally bilaterally. No masses are appreciated in the testicles. Left and right epididymis are normal. Rectal: Patient with  normal sphincter tone. Anus and perineum without scarring or rashes. No rectal masses are appreciated. Prostate is approximately 50 grams, right lobe > left lobe, no nodules are appreciated. Seminal vesicles could not be palpated. Skin: No rashes, bruises or  suspicious lesions. Lymph: No nguinal adenopathy. Neurologic: Grossly intact, no focal deficits, moving all 4 extremities. Psychiatric: Normal mood and affect.    Laboratory Data: See HPI I have reviewed the labs  Assessment & Plan:    1. BPH with LUTS IPSS score is 0/0, it is stable Continue conservative management, avoiding bladder irritants and timed voiding's Continue tamsulosin 0.4 mg daily; refills given  RTC in 12 months for IPSS, PSA and exam   2. Erectile dysfunction SHIM is 9, it improved Refill for Sildenafil 20 mg; refilled  RTC in 12 months for SHIM and exam.    3. History of elevated PSA Biopsy on 11/10/2006 for a PSA of 26.9 ng/mL with negative results Current PSA is 3.0 RTC in one year for PSA  4. History of nephrolithiasis No issues    5. Balanitis Restart Nystatin cream bid Patient to RTC in 2 weeks and if not resolved as we will need to biopsy to rule out cancer   Return in about 2 weeks (around 01/09/2019) for recheck .  Zara Council, PA-C  [facility] [address] [place], [place] [address] [phone]

## 2018-12-26 ENCOUNTER — Other Ambulatory Visit: Payer: Self-pay | Admitting: Family Medicine

## 2018-12-26 ENCOUNTER — Encounter: Payer: Self-pay | Admitting: Urology

## 2018-12-26 ENCOUNTER — Ambulatory Visit (INDEPENDENT_AMBULATORY_CARE_PROVIDER_SITE_OTHER): Payer: Medicare Other | Admitting: Urology

## 2018-12-26 VITALS — BP 122/75 | HR 92 | Ht 71.0 in | Wt 197.7 lb

## 2018-12-26 DIAGNOSIS — N200 Calculus of kidney: Secondary | ICD-10-CM | POA: Diagnosis not present

## 2018-12-26 DIAGNOSIS — Z87898 Personal history of other specified conditions: Secondary | ICD-10-CM | POA: Diagnosis not present

## 2018-12-26 DIAGNOSIS — N401 Enlarged prostate with lower urinary tract symptoms: Secondary | ICD-10-CM | POA: Diagnosis not present

## 2018-12-26 DIAGNOSIS — N481 Balanitis: Secondary | ICD-10-CM

## 2018-12-26 DIAGNOSIS — N138 Other obstructive and reflux uropathy: Secondary | ICD-10-CM

## 2018-12-26 DIAGNOSIS — N529 Male erectile dysfunction, unspecified: Secondary | ICD-10-CM

## 2018-12-26 LAB — PSA: Prostate Specific Ag, Serum: 2.9 ng/mL (ref 0.0–4.0)

## 2018-12-26 MED ORDER — TAMSULOSIN HCL 0.4 MG PO CAPS: 0.4000 mg | ORAL_CAPSULE | Freq: Every day | ORAL | 3 refills | Status: DC

## 2018-12-26 MED ORDER — SILDENAFIL CITRATE 20 MG PO TABS: ORAL_TABLET | ORAL | 3 refills | Status: DC

## 2019-01-10 ENCOUNTER — Ambulatory Visit: Payer: Medicare Other | Admitting: Urology

## 2019-02-06 ENCOUNTER — Telehealth: Payer: Self-pay | Admitting: Urology

## 2019-02-06 NOTE — Telephone Encounter (Signed)
I called and offered him an app tomorrow and he said he was busy all week and so I made one for next week and said if he made he made it if not IDK.    Sharyn Lull

## 2019-02-06 NOTE — Telephone Encounter (Signed)
Would you call Henry Shepard and get him scheduled for a follow up to look at his rash on his penis?

## 2019-02-13 ENCOUNTER — Ambulatory Visit: Payer: Medicare Other | Admitting: Urology

## 2019-03-06 ENCOUNTER — Telehealth: Payer: Self-pay | Admitting: Urology

## 2019-03-06 NOTE — Telephone Encounter (Signed)
LMOM for pt to call back to r/s appt.

## 2019-03-06 NOTE — Telephone Encounter (Signed)
Would you call Henry Shepard and have him reschedule his missed appointment to recheck the rash on his penis?

## 2019-04-04 ENCOUNTER — Encounter: Payer: Self-pay | Admitting: Urology

## 2019-11-26 NOTE — Progress Notes (Signed)
Error

## 2020-08-28 DIAGNOSIS — N1831 Chronic kidney disease, stage 3a: Secondary | ICD-10-CM | POA: Insufficient documentation

## 2023-09-20 ENCOUNTER — Encounter: Payer: Self-pay | Admitting: Internal Medicine

## 2023-09-20 ENCOUNTER — Telehealth: Payer: Self-pay | Admitting: Internal Medicine

## 2023-09-20 ENCOUNTER — Ambulatory Visit (INDEPENDENT_AMBULATORY_CARE_PROVIDER_SITE_OTHER): Admitting: Internal Medicine

## 2023-09-20 VITALS — BP 110/90 | HR 76 | Temp 98.6°F | Resp 16 | Ht 71.0 in | Wt 188.0 lb

## 2023-09-20 DIAGNOSIS — Z125 Encounter for screening for malignant neoplasm of prostate: Secondary | ICD-10-CM

## 2023-09-20 DIAGNOSIS — N401 Enlarged prostate with lower urinary tract symptoms: Secondary | ICD-10-CM

## 2023-09-20 DIAGNOSIS — N138 Other obstructive and reflux uropathy: Secondary | ICD-10-CM

## 2023-09-20 DIAGNOSIS — M109 Gout, unspecified: Secondary | ICD-10-CM | POA: Diagnosis not present

## 2023-09-20 DIAGNOSIS — R2689 Other abnormalities of gait and mobility: Secondary | ICD-10-CM

## 2023-09-20 DIAGNOSIS — M25552 Pain in left hip: Secondary | ICD-10-CM | POA: Diagnosis not present

## 2023-09-20 DIAGNOSIS — I10 Essential (primary) hypertension: Secondary | ICD-10-CM

## 2023-09-20 DIAGNOSIS — G8929 Other chronic pain: Secondary | ICD-10-CM

## 2023-09-20 DIAGNOSIS — E782 Mixed hyperlipidemia: Secondary | ICD-10-CM

## 2023-09-20 MED ORDER — AMLODIPINE BESYLATE 5 MG PO TABS
ORAL_TABLET | ORAL | 3 refills | Status: DC
Start: 2023-09-20 — End: 2023-10-11

## 2023-09-20 MED ORDER — TAMSULOSIN HCL 0.4 MG PO CAPS: 0.4000 mg | ORAL_CAPSULE | Freq: Every day | ORAL | 3 refills | Status: DC

## 2023-09-20 MED ORDER — ALLOPURINOL 100 MG PO TABS: ORAL_TABLET | ORAL | 3 refills | Status: DC

## 2023-09-20 NOTE — Telephone Encounter (Signed)
 Awaiting 09/20/23 office notes for Orthopedic referral-Toni

## 2023-09-20 NOTE — Progress Notes (Signed)
 [facility] [address] [place], [place] [address]  Internal MEDICINE  Office Visit Note  Patient Name: Henry Shepard  [id number]  [id number]  Date of Service: 10/05/2023   Complaints/HPI Pt is here for establishment of PCP. Chief Complaint  Patient presents with   New Patient (Initial Visit)   Hypertension   Hip Pain   Quality Metric Gaps    AWV   HPI Pt is here with his daughter  C/O left hip pain, has difficulty walking, takes mobic every day, unable to sleep at night due to pain, also feels off balance at times  Elevated blood pressure, does take Lisinopril H/O renal stones  Baseline elevated creatinine   Current Medication: Outpatient Encounter Medications as of 09/20/2023  Medication Sig Note   amLODipine  (NORVASC ) 5 MG tablet Take one tab po every day for blood pressure    cholecalciferol (VITAMIN D3) 25 MCG (1000 UNIT) tablet Take 1,000 Units by mouth daily. W/ Vitamin K    meloxicam (MOBIC) 7.5 MG tablet Take 7.5 mg by mouth daily.    [DISCONTINUED] allopurinol  (ZYLOPRIM ) 100 MG tablet Take 100 mg by mouth daily. 12/24/2014: Received from: External Pharmacy   [DISCONTINUED] metoprolol succinate (TOPROL-XL) 25 MG 24 hr tablet Take 25 mg by mouth daily. 12/24/2014: Received from: External Pharmacy   [DISCONTINUED] nystatin  cream (MYCOSTATIN ) Apply 1 application topically 2 (two) times daily.    [DISCONTINUED] sildenafil  (REVATIO ) 20 MG tablet TAKE 2 TO 5 TABLETS EVERY DAY AS NEEDED FOR SEXUAL ACTIVITY    [DISCONTINUED] SYNTHROID 50 MCG tablet Take 50 mcg by mouth every morning. 12/24/2014: Received from: External Pharmacy   [DISCONTINUED] tamsulosin  (FLOMAX ) 0.4 MG CAPS capsule Take 1 capsule (0.4 mg total) by mouth daily.    allopurinol  (ZYLOPRIM ) 100 MG tablet Take one tab po every day for gout    tamsulosin  (FLOMAX ) 0.4 MG CAPS capsule Take 1 capsule (0.4 mg total) by mouth daily.    [DISCONTINUED] lisinopril (PRINIVIL,ZESTRIL) 10 MG tablet Take by mouth.     [DISCONTINUED] omeprazole (PRILOSEC) 20 MG capsule Take by mouth.    No facility-administered encounter medications on file as of 09/20/2023.    Surgical History: Past Surgical History:  Procedure Laterality Date   COLONOSCOPY W/ BIOPSIES AND POLYPECTOMY  2018   ESOPHAGEAL DILATION  2018   none      Medical History: Past Medical History:  Diagnosis Date   Allergy    Asthma    BPH (benign prostatic hyperplasia)    ED (erectile dysfunction)    Elevated PSA    Gout    Gross hematuria    Hypertension    Hypertension    Kidney stone    Thyroid  disease    Urinary frequency     Family History: Family History  Problem Relation Age of Onset   Kidney cancer Neg Hx    Kidney disease Neg Hx    Prostate cancer Neg Hx    Bladder Cancer Neg Hx     Social History   Socioeconomic History   Marital status: Divorced    Spouse name: Not on file   Number of children: Not on file   Years of education: Not on file   Highest education level: Not on file  Occupational History   Not on file  Tobacco Use   Smoking status: Never   Smokeless tobacco: Never  Substance and Sexual Activity   Alcohol use: No    Alcohol/week: 0.0 standard drinks of alcohol   Drug  use: No   Sexual activity: Yes  Other Topics Concern   Not on file  Social History Narrative   Not on file   Social Drivers of Health   Financial Resource Strain: Low Risk  (09/14/2022)   Received from [facility]   Overall Financial Resource Strain (CARDIA)    Difficulty of Paying Living Expenses: Not very hard  Food Insecurity: No Food Insecurity (09/14/2022)   Received from [facility]   Hunger Vital Sign    Worried About Running Out of Food in the Last Year: Never true    Ran Out of Food in the Last Year: Never true  Transportation Needs: Unknown (09/14/2022)   Received from [facility] - Transportation    In the past 12 months, has lack of  transportation kept you from medical appointments or from getting medications?: No    Lack of Transportation (Non-Medical): Not on file  Physical Activity: Not on file  Stress: Not on file  Social Connections: Not on file  Intimate Partner Violence: Not on file     Review of Systems  Constitutional:  Negative for fatigue and fever.  HENT:  Negative for congestion, mouth sores and postnasal drip.   Respiratory:  Negative for cough.   Cardiovascular:  Negative for chest pain.  Genitourinary:  Negative for flank pain.  Musculoskeletal:  Positive for arthralgias, gait problem and joint swelling.  Psychiatric/Behavioral: Negative.      Vital Signs: BP (!) 110/90   Pulse 76   Temp 98.6 F (37 C)   Resp 16   Ht 5\' 11"  (1.803 m)   Wt 188 lb (85.3 kg)   SpO2 97%   BMI 26.22 kg/m    Physical Exam Constitutional:      Appearance: Normal appearance.  HENT:     Head: Normocephalic and atraumatic.     Nose: Nose normal.     Mouth/Throat:     Mouth: Mucous membranes are moist.     Pharynx: No posterior oropharyngeal erythema.  Eyes:     Extraocular Movements: Extraocular movements intact.     Pupils: Pupils are equal, round, and reactive to light.  Cardiovascular:     Pulses: Normal pulses.     Heart sounds: Normal heart sounds.  Pulmonary:     Effort: Pulmonary effort is normal.     Breath sounds: Normal breath sounds.  Musculoskeletal:        General: Tenderness present.     Comments: Decreased ROM, gait abnormality   Neurological:     General: No focal deficit present.     Mental Status: He is alert.  Psychiatric:        Mood and Affect: Mood normal.        Behavior: Behavior normal.       Assessment/Plan: 1. Chronic left hip pain (Primary) Pt needs to see ortho asap due to ADL limitations  - meloxicam (MOBIC) 7.5 MG tablet; Take 7.5 mg by mouth daily. - AMB referral to orthopedics - CBC with Differential/Platelet   2. Functional gait abnormality Due to  pain  - AMB referral to orthopedics - CBC with Differential/Platelet - TSH - T4, free  3. BPH with obstruction/lower urinary tract symptoms - tamsulosin  (FLOMAX ) 0.4 MG CAPS capsule; Take 1 capsule (0.4 mg total) by mouth daily.  Dispense: 90 capsule; Refill: 3   4. Gouty arthritis Will monitor  - cholecalciferol (VITAMIN D3) 25 MCG (1000 UNIT) tablet; Take 1,000  Units by mouth daily. W/ Vitamin K - allopurinol  (ZYLOPRIM ) 100 MG tablet; Take one tab po every day for gout  Dispense: 90 tablet; Refill: 3 - CBC with Differential/Platelet - TSH - T4, free - Comprehensive metabolic panel with GFR  5. Benign hypertension Will monitor renal functions, dc Ace inh, start norvasc   - amLODipine  (NORVASC ) 5 MG tablet; Take one tab po every day for blood pressure  Dispense: 90 tablet; Refill: 3 - CBC with Differential/Platelet - TSH - T4, free - Comprehensive metabolic panel with GFR  6. Screening for prostate cancer Will monitor  - PSA  7. Mixed hyperlipidemia Will treat accordingly  - Lipid Panel With LDL/HDL Ratio   General Counseling: Durrel verbalizes understanding of the findings of todays visit and agrees with plan of treatment. I have discussed any further diagnostic evaluation that may be needed or ordered today. We also reviewed his medications today. he has been encouraged to call the office with any questions or concerns that should arise related to todays visit.    Counseling:  [place] Controlled Substance Database was reviewed by me.  Orders Placed This Encounter  Procedures   CBC with Differential/Platelet   Lipid Panel With LDL/HDL Ratio   TSH   T4, free   Comprehensive metabolic panel with GFR   PSA   AMB referral to orthopedics    Meds ordered this encounter  Medications   amLODipine  (NORVASC ) 5 MG tablet    Sig: Take one tab po every day for blood pressure    Dispense:  90 tablet    Refill:  3   tamsulosin  (FLOMAX ) 0.4 MG CAPS capsule    Sig: Take 1  capsule (0.4 mg total) by mouth daily.    Dispense:  90 capsule    Refill:  3    Due for refill by 5/22   allopurinol  (ZYLOPRIM ) 100 MG tablet    Sig: Take one tab po every day for gout    Dispense:  90 tablet    Refill:  3    Pt is not due for refill ( 10/20/23    Time spent:45 Minutes

## 2023-09-21 LAB — LIPID PANEL WITH LDL/HDL RATIO
Cholesterol, Total: 197 mg/dL (ref 100–199)
HDL: 56 mg/dL (ref >39)
LDL Chol Calc (NIH): 129 mg/dL — ABNORMAL HIGH (ref 0–99)
LDL/HDL Ratio: 2.3 ratio (ref 0.0–3.6)
Triglycerides: 64 mg/dL (ref 0–149)
VLDL Cholesterol Cal: 12 mg/dL (ref 5–40)

## 2023-09-21 LAB — CBC WITH DIFFERENTIAL/PLATELET
Basophils Absolute: 0 10*3/uL (ref 0.0–0.2)
Basos: 1 %
EOS (ABSOLUTE): 0.2 10*3/uL (ref 0.0–0.4)
Eos: 3 %
Hematocrit: 43.9 % (ref 37.5–51.0)
Hemoglobin: 14.7 g/dL (ref 13.0–17.7)
Immature Grans (Abs): 0 10*3/uL (ref 0.0–0.1)
Immature Granulocytes: 1 %
Lymphocytes Absolute: 1.9 10*3/uL (ref 0.7–3.1)
Lymphs: 31 %
MCH: 31.4 pg (ref 26.6–33.0)
MCHC: 33.5 g/dL (ref 31.5–35.7)
MCV: 94 fL (ref 79–97)
Monocytes Absolute: 0.6 10*3/uL (ref 0.1–0.9)
Monocytes: 9 %
Neutrophils Absolute: 3.4 10*3/uL (ref 1.4–7.0)
Neutrophils: 55 %
Platelets: 148 10*3/uL — ABNORMAL LOW (ref 150–450)
RBC: 4.68 x10E6/uL (ref 4.14–5.80)
RDW: 13.2 % (ref 11.6–15.4)
WBC: 6.1 10*3/uL (ref 3.4–10.8)

## 2023-09-21 LAB — T4, FREE: Free T4: 1.27 ng/dL (ref 0.82–1.77)

## 2023-09-21 LAB — COMPREHENSIVE METABOLIC PANEL WITH GFR
ALT: 14 IU/L (ref 0–44)
AST: 17 IU/L (ref 0–40)
Albumin: 4.2 g/dL (ref 3.8–4.8)
Alkaline Phosphatase: 69 IU/L (ref 44–121)
BUN/Creatinine Ratio: 18 (ref 10–24)
BUN: 28 mg/dL — ABNORMAL HIGH (ref 8–27)
Bilirubin Total: 0.5 mg/dL (ref 0.0–1.2)
CO2: 23 mmol/L (ref 20–29)
Calcium: 9.5 mg/dL (ref 8.6–10.2)
Chloride: 105 mmol/L (ref 96–106)
Creatinine, Ser: 1.6 mg/dL — ABNORMAL HIGH (ref 0.76–1.27)
Globulin, Total: 2.4 g/dL (ref 1.5–4.5)
Glucose: 82 mg/dL (ref 70–99)
Potassium: 5.1 mmol/L (ref 3.5–5.2)
Sodium: 141 mmol/L (ref 134–144)
Total Protein: 6.6 g/dL (ref 6.0–8.5)
eGFR: 43 mL/min/{1.73_m2} — ABNORMAL LOW (ref >59)

## 2023-09-21 LAB — TSH: TSH: 0.684 u[IU]/mL (ref 0.450–4.500)

## 2023-09-21 LAB — PSA: Prostate Specific Ag, Serum: 3.4 ng/mL (ref 0.0–4.0)

## 2023-09-29 ENCOUNTER — Ambulatory Visit: Admitting: Nurse Practitioner

## 2023-10-05 ENCOUNTER — Telehealth: Payer: Self-pay | Admitting: Internal Medicine

## 2023-10-05 NOTE — Telephone Encounter (Signed)
 Orthopedic referral sent via [facility] to Dr.Hooten w/ [facility].  Notified patient. Gave pt telephone# [phone]-Toni

## 2023-10-06 ENCOUNTER — Telehealth: Payer: Self-pay | Admitting: Internal Medicine

## 2023-10-06 NOTE — Telephone Encounter (Signed)
 Orthopedic appointment 10/25/23/2025 @ [facility]

## 2023-10-11 ENCOUNTER — Encounter: Payer: Self-pay | Admitting: Internal Medicine

## 2023-10-11 ENCOUNTER — Ambulatory Visit (INDEPENDENT_AMBULATORY_CARE_PROVIDER_SITE_OTHER): Admitting: Internal Medicine

## 2023-10-11 VITALS — BP 140/82 | HR 88 | Temp 98.0°F | Resp 16 | Ht 71.0 in | Wt 186.0 lb

## 2023-10-11 DIAGNOSIS — G8929 Other chronic pain: Secondary | ICD-10-CM

## 2023-10-11 DIAGNOSIS — N401 Enlarged prostate with lower urinary tract symptoms: Secondary | ICD-10-CM

## 2023-10-11 DIAGNOSIS — M1A09X1 Idiopathic chronic gout, multiple sites, with tophus (tophi): Secondary | ICD-10-CM | POA: Diagnosis not present

## 2023-10-11 DIAGNOSIS — N138 Other obstructive and reflux uropathy: Secondary | ICD-10-CM

## 2023-10-11 DIAGNOSIS — M109 Gout, unspecified: Secondary | ICD-10-CM | POA: Diagnosis not present

## 2023-10-11 DIAGNOSIS — M25552 Pain in left hip: Secondary | ICD-10-CM

## 2023-10-11 DIAGNOSIS — I10 Essential (primary) hypertension: Secondary | ICD-10-CM | POA: Diagnosis not present

## 2023-10-11 DIAGNOSIS — N1831 Chronic kidney disease, stage 3a: Secondary | ICD-10-CM | POA: Diagnosis not present

## 2023-10-11 MED ORDER — AMLODIPINE BESYLATE 5 MG PO TABS
ORAL_TABLET | ORAL | 3 refills | Status: AC
Start: 2023-10-11 — End: ?

## 2023-10-11 MED ORDER — PREDNISONE 10 MG PO TABS: ORAL_TABLET | ORAL | 0 refills | Status: DC

## 2023-10-11 MED ORDER — METHYLPREDNISOLONE ACETATE 80 MG/ML IJ SUSP
80.0000 mg | Freq: Once | INTRAMUSCULAR | Status: AC
  Administered 2023-10-11: 60 mg via INTRAMUSCULAR

## 2023-10-11 MED ORDER — TAMSULOSIN HCL 0.4 MG PO CAPS: 0.4000 mg | ORAL_CAPSULE | Freq: Every day | ORAL | 3 refills | Status: AC

## 2023-10-11 MED ORDER — ALLOPURINOL 100 MG PO TABS: ORAL_TABLET | ORAL | 3 refills | Status: AC

## 2023-10-11 NOTE — Progress Notes (Signed)
 [facility] [address] [place], [place] [address]  Internal MEDICINE  Office Visit Note  Patient Name: Henry Shepard  [id number]  [id number]  Date of Service: 10/20/2023  Chief Complaint  Patient presents with   Follow-up    Labs    Pain    Left hips and leg     HPI  Pt is seen for follow up Continues to have hip pain, he is not taking mobic due to elevated renal functions BP is improved  Awaiting ortho consult     Current Medication: Outpatient Encounter Medications as of 10/11/2023  Medication Sig   cholecalciferol (VITAMIN D3) 25 MCG (1000 UNIT) tablet Take 1,000 Units by mouth daily. W/ Vitamin K   meloxicam (MOBIC) 7.5 MG tablet Take 7.5 mg by mouth daily.   predniSONE  (DELTASONE ) 10 MG tablet Take one tab 3 x day for 3 days, then take one tab 2 x a day for 3 days and then take one tab a day for 3 days for copd   [DISCONTINUED] allopurinol  (ZYLOPRIM ) 100 MG tablet Take one tab po every day for gout   [DISCONTINUED] amLODipine  (NORVASC ) 5 MG tablet Take one tab po every day for blood pressure   [DISCONTINUED] tamsulosin  (FLOMAX ) 0.4 MG CAPS capsule Take 1 capsule (0.4 mg total) by mouth daily.   allopurinol  (ZYLOPRIM ) 100 MG tablet Take one tab at night for gout   amLODipine  (NORVASC ) 5 MG tablet Take one tab po every day for blood pressure   tamsulosin  (FLOMAX ) 0.4 MG CAPS capsule Take 1 capsule (0.4 mg total) by mouth daily.   [EXPIRED] methylPREDNISolone  acetate (DEPO-MEDROL ) injection 80 mg    No facility-administered encounter medications on file as of 10/11/2023.    Surgical History: Past Surgical History:  Procedure Laterality Date   COLONOSCOPY W/ BIOPSIES AND POLYPECTOMY  2018   ESOPHAGEAL DILATION  2018   none      Medical History: Past Medical History:  Diagnosis Date   Allergy    Asthma    BPH (benign prostatic hyperplasia)    ED (erectile dysfunction)    Elevated PSA    Gout    Gross hematuria    Hypertension    Hypertension     Kidney stone    Thyroid  disease    Urinary frequency     Family History: Family History  Problem Relation Age of Onset   Kidney cancer Neg Hx    Kidney disease Neg Hx    Prostate cancer Neg Hx    Bladder Cancer Neg Hx     Social History   Socioeconomic History   Marital status: Divorced    Spouse name: Not on file   Number of children: Not on file   Years of education: Not on file   Highest education level: Not on file  Occupational History   Not on file  Tobacco Use   Smoking status: Never   Smokeless tobacco: Never  Substance and Sexual Activity   Alcohol use: No    Alcohol/week: 0.0 standard drinks of alcohol   Drug use: No   Sexual activity: Yes  Other Topics Concern   Not on file  Social History Narrative   Not on file   Social Drivers of Health   Financial Resource Strain: Low Risk  (09/14/2022)   Received from [facility]   Overall Financial Resource Strain (CARDIA)    Difficulty of Paying Living Expenses: Not very hard  Food Insecurity: No Food Insecurity (  09/14/2022)   Received from [facility]   Hunger Vital Sign    Worried About Running Out of Food in the Last Year: Never true    Ran Out of Food in the Last Year: Never true  Transportation Needs: Unknown (09/14/2022)   Received from [facility] - Transportation    In the past 12 months, has lack of transportation kept you from medical appointments or from getting medications?: No    Lack of Transportation (Non-Medical): Not on file  Physical Activity: Not on file  Stress: Not on file  Social Connections: Not on file  Intimate Partner Violence: Not on file      Review of Systems  Constitutional:  Negative for fatigue and fever.  HENT:  Negative for congestion, mouth sores and postnasal drip.   Respiratory:  Negative for cough.   Cardiovascular:  Negative for chest pain.  Genitourinary:  Negative for flank pain.   Musculoskeletal:  Positive for gait problem and joint swelling.  Psychiatric/Behavioral: Negative.      Vital Signs: BP (!) 140/82   Pulse 88   Temp 98 F (36.7 C)   Resp 16   Ht 5\' 11"  (1.803 m)   Wt 186 lb (84.4 kg)   SpO2 99%   BMI 25.94 kg/m    Physical Exam Constitutional:      Appearance: Normal appearance.  HENT:     Head: Normocephalic and atraumatic.     Nose: Nose normal.     Mouth/Throat:     Mouth: Mucous membranes are moist.     Pharynx: No posterior oropharyngeal erythema.  Eyes:     Extraocular Movements: Extraocular movements intact.     Pupils: Pupils are equal, round, and reactive to light.  Cardiovascular:     Pulses: Normal pulses.     Heart sounds: Normal heart sounds.  Pulmonary:     Effort: Pulmonary effort is normal.     Breath sounds: Normal breath sounds.  Musculoskeletal:        General: Tenderness present.  Neurological:     General: No focal deficit present.     Mental Status: He is alert.  Psychiatric:        Mood and Affect: Mood normal.        Behavior: Behavior normal.        Assessment/Plan: 1. Chronic left hip pain (Primary) Patient continues to be very uncomfortable difficulty walking unable to sleep at night we will go ahead and start on prednisone  since patient is unable to take meloxicam due to renal insufficiency - predniSONE  (DELTASONE ) 10 MG tablet; Take one tab 3 x day for 3 days, then take one tab 2 x a day for 3 days and then take one tab a day for 3 days for copd  Dispense: 18 tablet; Refill: 0 - methylPREDNISolone  acetate (DEPO-MEDROL ) injection 80 mg  2. Benign hypertension Improved blood pressure recheck blood pressure which is a better - amLODipine  (NORVASC ) 5 MG tablet; Take one tab po every day for blood pressure  Dispense: 90 tablet; Refill: 3  3. BPH with obstruction/lower urinary tract symptoms Patient sees urology - tamsulosin  (FLOMAX ) 0.4 MG CAPS capsule; Take 1 capsule (0.4 mg total) by mouth  daily.  Dispense: 90 capsule; Refill: 3  4. Stage 3a chronic kidney disease (HCC) Will continue to monitor might have to see nephrology  5. Idiopathic chronic gout of multiple sites with tophus This is stable - allopurinol  (ZYLOPRIM ) 100 MG  tablet; Take one tab at night for gout  Dispense: 90 tablet; Refill: 3   General Counseling: Jovani verbalizes understanding of the findings of todays visit and agrees with plan of treatment. I have discussed any further diagnostic evaluation that may be needed or ordered today. We also reviewed his medications today. he has been encouraged to call the office with any questions or concerns that should arise related to todays visit.    No orders of the defined types were placed in this encounter.   Meds ordered this encounter  Medications   allopurinol  (ZYLOPRIM ) 100 MG tablet    Sig: Take one tab at night for gout    Dispense:  90 tablet    Refill:  3   amLODipine  (NORVASC ) 5 MG tablet    Sig: Take one tab po every day for blood pressure    Dispense:  90 tablet    Refill:  3   tamsulosin  (FLOMAX ) 0.4 MG CAPS capsule    Sig: Take 1 capsule (0.4 mg total) by mouth daily.    Dispense:  90 capsule    Refill:  3   predniSONE  (DELTASONE ) 10 MG tablet    Sig: Take one tab 3 x day for 3 days, then take one tab 2 x a day for 3 days and then take one tab a day for 3 days for copd    Dispense:  18 tablet    Refill:  0   methylPREDNISolone  acetate (DEPO-MEDROL ) injection 80 mg    Total time spent:35 Minutes Time spent includes review of chart, medications, test results, and follow up plan with the patient.   [place] Controlled Substance Database was reviewed by me.   Dr Ladoris Lythgoe M Tenishia Ekman Internal medicine

## 2023-11-19 NOTE — Discharge Instructions (Signed)
 Instructions after Total Hip Replacement   Henry Shepard P. Henry Shepard., M.D.    Dept. of Orthopaedics & Sports Medicine [facility] [address] [place], [place]  [address]  Phone: [phone]   Fax: [phone]        [email or web address]        DIET: Drink plenty of non-alcoholic fluids. Resume your normal diet. Include foods high in fiber.  ACTIVITY:  You may use crutches or a walker with weight-bearing as tolerated, unless instructed otherwise. You may be weaned off of the walker or crutches by your Physical Therapist.  Do NOT reach below the level of your knees or cross your legs until allowed.    Continue doing gentle exercises. Exercising will reduce the pain and swelling, increase motion, and prevent muscle weakness.   Please continue to use the TED compression stockings for 6 weeks. You may remove the stockings at night, but should reapply them in the morning. Do not drive or operate any equipment until instructed.  WOUND CARE:  Continue to use ice packs periodically to reduce pain and swelling. The initial dressing (Aquacel) can remain in place for 7 days (see separate instructions). Keep the incision clean and dry. You may bathe or shower after the staples are removed at the first office visit following surgery.  MEDICATIONS: You may resume your regular medications. Please take the pain medication as prescribed on the medication. Do not take pain medication on an empty stomach. Unless instructed otherwise, you should take an enteric-coated aspirin 81 mg. TWICE a day. (This along with elevation will help reduce the possibility of blood clots/phlebitis in your operated leg.) Use a stool softener (such as Senokot-S or Colace) daily and a laxative (such as Miralax or Dulcolax) as needed to prevent constipation.  Do not drive or drink alcoholic beverages when taking pain medications.  CALL THE OFFICE FOR: Temperature above 101 degrees Excessive bleeding or drainage  on the dressing. Excessive swelling, coldness, or paleness of the toes. Persistent nausea and vomiting.  FOLLOW-UP:  You should have an appointment to return to the office in 6 weeks after surgery. Arrangements have been made for continuation of Physical Therapy (either home therapy or outpatient therapy).     [facility] Department Directory         [email or web address]       [email or web address]          Cardiology  Appointments: [place] - [phone]  Endocrinology  Appointments: [place] [phone] [place] - [phone]  Gastroenterology  Appointments: [place] [phone] [place] - [phone]        General Surgery   Appointments: [facility]  Internal Medicine/Family Medicine  Appointments: [facility] [place] - [phone] [place] - [phone]  Metabolic and Weigh Loss Surgery  Appointments: [facility]        Neurology  Appointments: [place] [phone] [place] - [phone]  Neurosurgery  Appointments: [place]  Obstetrics & Gynecology  Appointments: [place] [phone] [place] - [phone]        Pediatrics  Appointments: [facility]rie Sport [phone] [place] - [phone]  Physiatry  Appointments: [place] [phone]  Physical Therapy  Appointments: [place] - [phone]        Podiatry  Appointments: [place] [phone] [place] - [phone]  Pulmonology  Appointments: [place]  Rheumatology  Appointments: [place] [phone]        [place] Location: [facility]  [address] [place], [place]  [address]  [place]  Location: [facility]. [address] [place], [place]  [address]  [place] Location: [facility] [address] [place], [place]  [address]

## 2023-11-23 ENCOUNTER — Encounter
Admission: RE | Admit: 2023-11-23 | Discharge: 2023-11-23 | Disposition: A | Discharge status: Home / Self Care | Source: Ambulatory Visit | Attending: Orthopedic Surgery | Admitting: Orthopedic Surgery

## 2023-11-23 ENCOUNTER — Other Ambulatory Visit: Payer: Self-pay

## 2023-11-23 VITALS — BP 148/92 | HR 81 | Temp 98.8°F | Resp 18 | Ht 71.0 in | Wt 189.0 lb

## 2023-11-23 DIAGNOSIS — N138 Other obstructive and reflux uropathy: Secondary | ICD-10-CM | POA: Diagnosis not present

## 2023-11-23 DIAGNOSIS — Z01818 Encounter for other preprocedural examination: Secondary | ICD-10-CM | POA: Diagnosis present

## 2023-11-23 DIAGNOSIS — Z01812 Encounter for preprocedural laboratory examination: Secondary | ICD-10-CM

## 2023-11-23 DIAGNOSIS — N401 Enlarged prostate with lower urinary tract symptoms: Secondary | ICD-10-CM | POA: Insufficient documentation

## 2023-11-23 DIAGNOSIS — N200 Calculus of kidney: Secondary | ICD-10-CM | POA: Insufficient documentation

## 2023-11-23 DIAGNOSIS — E042 Nontoxic multinodular goiter: Secondary | ICD-10-CM | POA: Diagnosis not present

## 2023-11-23 DIAGNOSIS — M1612 Unilateral primary osteoarthritis, left hip: Secondary | ICD-10-CM | POA: Diagnosis not present

## 2023-11-23 DIAGNOSIS — I10 Essential (primary) hypertension: Secondary | ICD-10-CM | POA: Diagnosis not present

## 2023-11-23 DIAGNOSIS — M1A00X0 Idiopathic chronic gout, unspecified site, without tophus (tophi): Secondary | ICD-10-CM | POA: Insufficient documentation

## 2023-11-23 HISTORY — DX: Gastro-esophageal reflux disease without esophagitis: K21.9

## 2023-11-23 LAB — TYPE AND SCREEN
ABO/RH(D): AB POS
Antibody Screen: NEGATIVE

## 2023-11-23 LAB — URINALYSIS, ROUTINE W REFLEX MICROSCOPIC
Bilirubin Urine: NEGATIVE
Glucose, UA: NEGATIVE mg/dL
Hgb urine dipstick: NEGATIVE
Ketones, ur: NEGATIVE mg/dL
Nitrite: NEGATIVE
Protein, ur: NEGATIVE mg/dL
Specific Gravity, Urine: 1.014 (ref 1.005–1.030)
pH: 5 (ref 5.0–8.0)

## 2023-11-23 LAB — COMPREHENSIVE METABOLIC PANEL WITH GFR
ALT: 20 U/L (ref 0–44)
AST: 19 U/L (ref 15–41)
Albumin: 3.9 g/dL (ref 3.5–5.0)
Alkaline Phosphatase: 57 U/L (ref 38–126)
Anion gap: 8 (ref 5–15)
BUN: 23 mg/dL (ref 8–23)
CO2: 25 mmol/L (ref 22–32)
Calcium: 9.5 mg/dL (ref 8.9–10.3)
Chloride: 106 mmol/L (ref 98–111)
Creatinine, Ser: 1.58 mg/dL — ABNORMAL HIGH (ref 0.61–1.24)
GFR, Estimated: 44 mL/min — ABNORMAL LOW (ref >60)
Glucose, Bld: 82 mg/dL (ref 70–99)
Potassium: 3.9 mmol/L (ref 3.5–5.1)
Sodium: 139 mmol/L (ref 135–145)
Total Bilirubin: 1.1 mg/dL (ref 0.0–1.2)
Total Protein: 7.5 g/dL (ref 6.5–8.1)

## 2023-11-23 LAB — SEDIMENTATION RATE: Sed Rate: 7 mm/h (ref 0–20)

## 2023-11-23 LAB — C-REACTIVE PROTEIN: CRP: <0.5 mg/dL (ref <1.0)

## 2023-11-23 LAB — HEMOGLOBIN AND HEMATOCRIT, BLOOD
HCT: 44.8 % (ref 39.0–52.0)
Hemoglobin: 14.9 g/dL (ref 13.0–17.0)

## 2023-11-23 LAB — SURGICAL PCR SCREEN
MRSA, PCR: NEGATIVE
Staphylococcus aureus: NEGATIVE

## 2023-11-23 NOTE — Patient Instructions (Signed)
 Your procedure is scheduled nw:FNWIJB JULY 7  Report to the Registration Desk on the 1st floor of the [facility] To find out your arrival time, please call [phone] between 1PM - 3PM on:  THURSDAY JULY 3  If your arrival time is 6:00 am, do not arrive before that time as the [facility] entrance doors do not open until 6:00 am.  REMEMBER: Instructions that are not followed completely may result in serious medical risk, up to and including death; or upon the discretion of your surgeon and anesthesiologist your surgery may need to be rescheduled.  Do not eat food after midnight the night before surgery.  No gum chewing or hard candies.  You may however, drink CLEAR liquids up to 4:30 the morning you are scheduled to arrive for your surgery. Do not drink anything after 4:30 am.   Clear liquids include: - water  - apple juice without pulp - gatorade (not RED colors) - black coffee or tea (Do NOT add milk or creamers to the coffee or tea) Do NOT drink anything that is not on this list.   In addition, your doctor has ordered for you to drink the provided:  Ensure Pre-Surgery Clear Carbohydrate Drink  Drinking this carbohydrate drink up to two hours before surgery helps to reduce insulin resistance and improve patient outcomes. Please complete drinking by 4:30 the morning of your surgery.  One week prior to surgery: [facility] June 30  Stop Anti-inflammatories (NSAIDS) such as Advil, Aleve, Ibuprofen, Motrin, Naproxen, Naprosyn and Aspirin based products such as Excedrin, Goody's Powder, BC Powder. Stop ANY OVER THE COUNTER supplements until after surgery. Multiple Vitamins-Minerals (MULTIVITAMIN WITH MINERALS)   You may however, continue to take Tylenol if needed for pain up until the day of surgery.  **Follow guidelines for insulin and diabetes medications.**  **Follow recommendations regarding stopping blood thinners.**  Continue taking all of your other prescription medications  up until the day of surgery.  ON THE DAY OF SURGERY DO NOT TAKE ANY MEDICATIONS   No Alcohol for 24 hours before or after surgery.  No Smoking including e-cigarettes for 24 hours before surgery.  No chewable tobacco products for at least 6 hours before surgery.  No nicotine patches on the day of surgery.  Do not use any recreational drugs for at least a week (preferably 2 weeks) before your surgery.  Please be advised that the combination of cocaine and anesthesia may have negative outcomes, up to and including death. If you test positive for cocaine, your surgery will be cancelled.  On the morning of surgery brush your teeth with toothpaste and water, you may rinse your mouth with mouthwash if you wish. Do not swallow any toothpaste or mouthwash.  Use CHG Soap as directed on instruction sheet.  Do not wear jewelry, make-up, hairpins, clips or nail polish.  For welded (permanent) jewelry: bracelets, anklets, waist bands, etc.  Please have this removed prior to surgery.  If it is not removed, there is a chance that hospital personnel will need to cut it off on the day of surgery.  Do not wear lotions, powders, or perfumes.   Do not shave body hair from the neck down 48 hours before surgery.  Contact lenses, hearing aids and dentures may not be worn into surgery.  Do not bring valuables to the hospital. [facility] is not responsible for any missing/lost belongings or valuables.   Notify your doctor if there is any change in your medical condition (cold,  fever, infection).  Wear comfortable clothing (specific to your surgery type) to the hospital.  After surgery, you can help prevent lung complications by doing breathing exercises.  Take deep breaths and cough every 1-2 hours. Your doctor may order a device called an Incentive Spirometer to help you take deep breaths.  If you are being admitted to the hospital overnight, leave your suitcase in the car. After surgery it may be  brought to your room.  In case of increased patient census, it may be necessary for you, the patient, to continue your postoperative care in the Same Day Surgery department.  If you are being discharged the day of surgery, you will not be allowed to drive home. You will need a responsible individual to drive you home and stay with you for 24 hours after surgery.   If you are taking public transportation, you will need to have a responsible individual with you.  Please call the Pre-admissions Testing Dept. at [phone] if you have any questions about these instructions.  Surgery Visitation Policy:  Patients having surgery or a procedure may have two visitors.  Children under the age of 61 must have an adult with them who is not the patient.  Inpatient Visitation:    Visiting hours are 7 a.m. to 8 p.m. Up to four visitors are allowed at one time in a patient room. The visitors may rotate out with other people during the day.  One visitor age 48 or older may stay with the patient overnight and must be in the room by 8 p.m.   Merchandiser, retail to address health-related social needs:  [email or web address]        Pre-operative 5 CHG Bath Instructions   You can play a key role in reducing the risk of infection after surgery. Your skin needs to be as free of germs as possible. You can reduce the number of germs on your skin by washing with CHG (chlorhexidine gluconate) soap before surgery. CHG is an antiseptic soap that kills germs and continues to kill germs even after washing.   DO NOT use if you have an allergy to chlorhexidine/CHG or antibacterial soaps. If your skin becomes reddened or irritated, stop using the CHG and notify one of our RNs at [phone].   Please shower with the CHG soap starting 4 days before surgery using the following schedule:    START THURSDAY JULY 3     Please keep in mind the following:  DO NOT shave, including legs and  underarms, starting the day of your first shower.   You may shave your face at any point before/day of surgery.  Place clean sheets on your bed the day you start using CHG soap. Use a clean washcloth (not used since being washed) for each shower. DO NOT sleep with pets once you start using the CHG.   CHG Shower Instructions:  If you choose to wash your hair and private area, wash first with your normal shampoo/soap.  After you use shampoo/soap, rinse your hair and body thoroughly to remove shampoo/soap residue.  Turn the water OFF and apply about 3 tablespoons (45 ml) of CHG soap to a CLEAN washcloth.  Apply CHG soap ONLY FROM YOUR NECK DOWN TO YOUR TOES (washing for 3-5 minutes)  DO NOT use CHG soap on face, private areas, open wounds, or sores.  Pay special attention to the area where your surgery is being performed.  If you are having back surgery, having someone wash your  back for you may be helpful. Wait 2 minutes after CHG soap is applied, then you may rinse off the CHG soap.  Pat dry with a clean towel  Put on clean clothes/pajamas   If you choose to wear lotion, please use ONLY the CHG-compatible lotions on the back of this paper.     Additional instructions for the day of surgery: DO NOT APPLY any lotions, deodorants, cologne, or perfumes.   Put on clean/comfortable clothes.  Brush your teeth.  Ask your nurse before applying any prescription medications to the skin.      CHG Compatible Lotions   Aveeno Moisturizing lotion  Cetaphil Moisturizing Cream  Cetaphil Moisturizing Lotion  Clairol Herbal Essence Moisturizing Lotion, Dry Skin  Clairol Herbal Essence Moisturizing Lotion, Extra Dry Skin  Clairol Herbal Essence Moisturizing Lotion, Normal Skin  Curel Age Defying Therapeutic Moisturizing Lotion with Alpha Hydroxy  Curel Extreme Care Body Lotion  Curel Soothing Hands Moisturizing Hand Lotion  Curel Therapeutic Moisturizing Cream, Fragrance-Free  Curel Therapeutic  Moisturizing Lotion, Fragrance-Free  Curel Therapeutic Moisturizing Lotion, Original Formula  Eucerin Daily Replenishing Lotion  Eucerin Dry Skin Therapy Plus Alpha Hydroxy Crme  Eucerin Dry Skin Therapy Plus Alpha Hydroxy Lotion  Eucerin Original Crme  Eucerin Original Lotion  Eucerin Plus Crme Eucerin Plus Lotion  Eucerin TriLipid Replenishing Lotion  Keri Anti-Bacterial Hand Lotion  Keri Deep Conditioning Original Lotion Dry Skin Formula Softly Scented  Keri Deep Conditioning Original Lotion, Fragrance Free Sensitive Skin Formula  Keri Lotion Fast Absorbing Fragrance Free Sensitive Skin Formula  Keri Lotion Fast Absorbing Softly Scented Dry Skin Formula  Keri Original Lotion  Keri Skin Renewal Lotion Keri Silky Smooth Lotion  Keri Silky Smooth Sensitive Skin Lotion  Nivea Body Creamy Conditioning Oil  Nivea Body Extra Enriched Lotion  Nivea Body Original Lotion  Nivea Body Sheer Moisturizing Lotion Nivea Crme  Nivea Skin Firming Lotion  NutraDerm 30 Skin Lotion  NutraDerm Skin Lotion  NutraDerm Therapeutic Skin Cream  NutraDerm Therapeutic Skin Lotion  ProShield Protective Hand Cream  Provon moisturizing lotion      How to Use an Incentive Spirometer  An incentive spirometer is a tool that measures how well you are filling your lungs with each breath. Learning to take long, deep breaths using this tool can help you keep your lungs clear and active. This may help to reverse or lessen your chance of developing breathing (pulmonary) problems, especially infection. You may be asked to use a spirometer: After a surgery. If you have a lung problem or a history of smoking. After a long period of time when you have been unable to move or be active. If the spirometer includes an indicator to show the highest number that you have reached, your health care provider or respiratory therapist will help you set a goal. Keep a log of your progress as told by your health care  provider. What are the risks? Breathing too quickly may cause dizziness or cause you to pass out. Take your time so you do not get dizzy or light-headed. If you are in pain, you may need to take pain medicine before doing incentive spirometry. It is harder to take a deep breath if you are having pain. How to use your incentive spirometer  Sit up on the edge of your bed or on a chair. Hold the incentive spirometer so that it is in an upright position. Before you use the spirometer, breathe out normally. Place the mouthpiece in your mouth. Make sure  your lips are closed tightly around it. Breathe in slowly and as deeply as you can through your mouth, causing the piston or the ball to rise toward the top of the chamber. Hold your breath for 3-5 seconds, or for as long as possible. If the spirometer includes a coach indicator, use this to guide you in breathing. Slow down your breathing if the indicator goes above the marked areas. Remove the mouthpiece from your mouth and breathe out normally. The piston or ball will return to the bottom of the chamber. Rest for a few seconds, then repeat the steps 10 or more times. Take your time and take a few normal breaths between deep breaths so that you do not get dizzy or light-headed. Do this every 1-2 hours when you are awake. If the spirometer includes a goal marker to show the highest number you have reached (best effort), use this as a goal to work toward during each repetition. After each set of 10 deep breaths, cough a few times. This will help to make sure that your lungs are clear. If you have an incision on your chest or abdomen from surgery, place a pillow or a rolled-up towel firmly against the incision when you cough. This can help to reduce pain while taking deep breaths and coughing. General tips When you are able to get out of bed: Walk around often. Continue to take deep breaths and cough in order to clear your lungs. Keep using the  incentive spirometer until your health care provider says it is okay to stop using it. If you have been in the hospital, you may be told to keep using the spirometer at home. Contact a health care provider if: You are having difficulty using the spirometer. You have trouble using the spirometer as often as instructed. Your pain medicine is not giving enough relief for you to use the spirometer as told. You have a fever. Get help right away if: You develop shortness of breath. You develop a cough with bloody mucus from the lungs. You have fluid or blood coming from an incision site after you cough. Summary An incentive spirometer is a tool that can help you learn to take long, deep breaths to keep your lungs clear and active. You may be asked to use a spirometer after a surgery, if you have a lung problem or a history of smoking, or if you have been inactive for a long period of time. Use your incentive spirometer as instructed every 1-2 hours while you are awake. If you have an incision on your chest or abdomen, place a pillow or a rolled-up towel firmly against your incision when you cough. This will help to reduce pain. Get help right away if you have shortness of breath, you cough up bloody mucus, or blood comes from your incision when you cough. This information is not intended to replace advice given to you by your health care provider. Make sure you discuss any questions you have with your health care provider. Document Revised: 08/06/2019 Document Reviewed: 08/06/2019 Elsevier Patient Education  2023 Elsevier Inc.                Preoperative Educational Videos for Total Hip, Knee and Shoulder Replacements  To better prepare for surgery, please view our videos that explain the physical activity and discharge planning required to have the best surgical recovery at [facility].  [email or web address]  Questions? Call [phone] or email [email or web address]

## 2023-11-23 NOTE — H&P (View-Only) (Signed)
 NAME: Henry Shepard  H&P Date: 11/23/2023   Procedure Date: 12/05/2023  Chief Complaint: left hip pain  HPI  Henry Shepard is a 83 y.o. male who has severe Left hip pain.  Patient has a several year history of left hip pain that is progressively gotten worse over the last year.  He states that the pain localizes in the anterior portion of his groin and sometimes towards the back of his hip.  He states that the pain is made worse with any attempted ambulation, standing or rotation of the hip.  He states that he notices it grinding.  It has greatly affected his ability to ambulate long distances and perform his ADLs as he would like, he uses the example of putting on his socks in the morning. He has failed conservative treatment including Tylenol , NSAIDs and activity modification.  He is not currently utilizing any ambulatory aids.   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  He denies any previous cardiac issues.  Does have a history of asthma.  No previous clots or DVTs.  Patient is not a diabetic.  Denies any previous surgeries on this hip.  Social Hx: Patient lives at home alone, but states that he will be staying with his daughter for 2 weeks postoperatively to help look after him.  He denies any alcohol, illicit drug or nicotine use.  No history of smoking.   Medications & Allergies  Allergies: Allergies  Allergen Reactions  . Atorvastatin Rash    Home Medicines: Current Outpatient Medications on File Prior to Visit  Medication Sig Dispense Refill  . acetaminophen  (TYLENOL ) 650 MG ER tablet Take 650 mg by mouth every 8 (eight) hours as needed for Pain    . allopurinoL  (ZYLOPRIM ) 100 MG tablet TAKE 1 TABLET BY MOUTH ONCE DAILY. 90 tablet 3  . amLODIPine  (NORVASC ) 5 MG tablet Take 5 mg by mouth once daily    . cholecalciferol (VITAMIN D3) 1000 unit tablet Take 1,000 Units by mouth once daily    . lisinopriL (ZESTRIL) 10 MG tablet TAKE 1 TABLET BY MOUTH ONCE DAILY. 100  tablet 0  . meloxicam (MOBIC) 7.5 MG tablet Take 7.5 mg by mouth once daily    . omeprazole (PRILOSEC) 40 MG DR capsule TAKE 1 CAPSULE BY MOUTH EVERY DAY 90 capsule 4  . sildenafil  (REVATIO ) 20 mg tablet TAKE 2-5 TABLETS EVERY DAY AS NEEDED FORSEXUAL ACTIVITY 90 tablet 3  . tamsulosin  (FLOMAX ) 0.4 mg capsule TAKE ONE CAPSULE DAILY 30 MINUTES AFTER THE SAME MEAL EACH DAY 100 capsule 0   No current facility-administered medications on file prior to visit.    Medical / Surgical History   Past Medical History:  Diagnosis Date  . Allergic state   . Arthritis   . Asthma without status asthmaticus (HHS-HCC)   . Dysphagia 10/20/2016  . GERD without esophagitis 04/11/2017  . Hypertension   . Nephrolithiasis 08/20/2016  . Thyroid  disease      Past Surgical History:  Procedure Laterality Date  . ESOPHAGOGASTRODOUDENOSCOPY W/DILATION  01/10/2017   Procedure: ESOPHAGOGASTRODUODENOSCOPY, FLEXIBLE, TRANSORAL; WITH TRANSENDOSCOPIC BALLOON DILATION OF ESOPHAGUS (LESS THAN 30 MM DIAMETER);  Surgeon: Alm Veria Barcelona, MD;  Location: [facility] ENDO/[facility];  Service: Gastroenterology;;  . COLONOSCOPY W/BIOPSY  01/10/2017   Procedure: COLONOSCOPY, FLEXIBLE; WITH BIOPSY, SINGLE OR MULTIPLE;  Surgeon: Alm Veria Barcelona, MD;  Location: [facility] ENDO/BRONCH;  Service: Gastroenterology;;  . COLONOSCOPY W/REMOVAL LESIONS BY SNARE  01/10/2017   Procedure: COLONOSCOPY, FLEXIBLE; WITH REMOVAL  OF TUMOR(S), POLYP(S), OR OTHER LESION(S) BY SNARE TECHNIQUE;  Surgeon: Alm Veria Barcelona, MD;  Location: [facility] ENDO/BRONCH;  Service: Gastroenterology;;  . COLONOSCOPY W/INJECTION  01/10/2017   Procedure: COLONOSCOPY, FLEXIBLE; WITH DIRECTED SUBMUCOSAL INJECTION(S), ANY SUBSTANCE;  Surgeon: Alm Veria Barcelona, MD;  Location: [facility] ENDO/BRONCH;  Service: Gastroenterology;;  . CORI W/REMOVAL LESION BY SNARE N/A 12/19/2019   Procedure: EGD with Dilation;  Surgeon: Orval Elsie Ned, MD;  Location: [facility]  ENDO/BRONCH;  Service: Gastroenterology;  Laterality: N/A;  . ESOPHAGOGASTRODOUDENOSCOPY W/BIOPSY N/A 12/19/2019   Procedure: ESOPHAGOGASTRODUODENOSCOPY, FLEXIBLE, TRANSORAL; WITH BIOPSY, SINGLE OR MULTIPLE;  Surgeon: Orval Elsie Ned, MD;  Location: [facility] ENDO/BRONCH;  Service: Gastroenterology;  Laterality: N/A;  . VASECTOMY       Physical Exam   Ht:  Wt:85.7 kg (189 lb) BMI: Body mass index is 27.12 kg/m.  General/Constitutional: No apparent distress: well-nourished and well developed. Eyes: Pupils equal, round with synchronous movement. Lymphatic: No palpable adenopathy. Respiratory: Patient has good chest rise and fall with inspiration and expiration.  All lung fields are clear to auscultation bilaterally.  There is no Rales, rhonchi or wheezes appreciated. Cardiovascular: Upon auscultation there is a regular rate and rhythm without any murmurs, rubs, gallops or heaves appreciated.  There does not appear to be any swelling down the lower extremities.  Posterior tibial pulses appreciated bilaterally, 2+. Integumentary: No impressive skin lesions present, except as noted in detailed exam. Neuro/Psych: Normal mood and affect, oriented to person, place and time. Musculoskeletal: see exam below  Left hip exam Left Hip:  Upon inspection of the patient's left hip, there does not appear to be any noticeable open deformity laceration, swelling, erythema or gross deformity to the leg   Pelvic tilt:  Negative  Limb lengths:  Equal with the patient standing  Soft tissue swelling: Negative  Erythema:  Negative  Crepitance:  Negative  Tenderness:  Greater trochanter is nontender to palpation. Moderate pain is elicited by axial compression or extremes of rotation.  Atrophy:  No atrophy. Poor to fair hip flexor and abductor strength.  Range of Motion:  EXT/FLEX: 0/85    ADD/ABD: 10/10    IR/ER: 5/15  Patient is neurovascularly intact to all dermatomes extending down there Left  lower extremity to all dermatomes.  Posterior tibial pulses were appreciated, 2+.  Imaging  Hip Imaging: None ordered today.  Previous images from 10/25/2023 were reviewed.  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  There are subchondral changes appreciated.  Osteophyte formation present.  Cystic changes both to the femoral head and acetabular cup along the superior margins.  No fractures, lytic lesions or gross fomites appreciated on films.  Assesment and Plan  Hip DJD  I have recommended that Henry Shepard undergo left total hip replacement.  Consents has been signed.  The risks, benefits, prognosis and alternatives including but not limited to DVT, PE, infection, neurovascular injury, failure of the procedure and death were explained to the patient and he is willing to proceed with surgery as described to him by myself.  Plan will be for post operative admission of at least 1 midnight for pain control and PT.  He will be managed with DVT prophylaxis, antibiotics preoperatively for 24 hours and aggressive in patient rehab.  Pre, intra and post op interventions were discussed. Patient has good understanding   Medication Reconciliation was performed. Discussed cessation of NSAIDs, vitamins and supplements.  A total of 45 minutes was spent reviewing patient's charts, medical reconciliation, discussing/educating the patient about surgical interventions, and answering any questions provided by the patient.  JOSHUA DALLAS KOYANAGI, [place] [facility] 11/23/2023

## 2023-12-04 ENCOUNTER — Encounter: Payer: Self-pay | Admitting: Orthopedic Surgery

## 2023-12-05 ENCOUNTER — Encounter
Admission: RE | Disposition: A | Discharge status: Home / Self Care | Payer: Self-pay | Source: Home / Self Care | Attending: Orthopedic Surgery

## 2023-12-05 ENCOUNTER — Other Ambulatory Visit: Payer: Self-pay

## 2023-12-05 ENCOUNTER — Observation Stay

## 2023-12-05 ENCOUNTER — Ambulatory Visit: Admitting: Certified Registered"

## 2023-12-05 ENCOUNTER — Ambulatory Visit: Payer: Self-pay | Admitting: Urgent Care

## 2023-12-05 ENCOUNTER — Observation Stay
Admission: RE | Admit: 2023-12-05 | Discharge: 2023-12-06 | Disposition: A | Discharge status: Home / Self Care | Attending: Orthopedic Surgery | Admitting: Orthopedic Surgery

## 2023-12-05 ENCOUNTER — Encounter: Payer: Self-pay | Admitting: Orthopedic Surgery

## 2023-12-05 DIAGNOSIS — N401 Enlarged prostate with lower urinary tract symptoms: Secondary | ICD-10-CM

## 2023-12-05 DIAGNOSIS — M1612 Unilateral primary osteoarthritis, left hip: Secondary | ICD-10-CM | POA: Diagnosis present

## 2023-12-05 DIAGNOSIS — N200 Calculus of kidney: Secondary | ICD-10-CM

## 2023-12-05 DIAGNOSIS — J45909 Unspecified asthma, uncomplicated: Secondary | ICD-10-CM | POA: Insufficient documentation

## 2023-12-05 DIAGNOSIS — N138 Other obstructive and reflux uropathy: Secondary | ICD-10-CM

## 2023-12-05 DIAGNOSIS — I10 Essential (primary) hypertension: Secondary | ICD-10-CM | POA: Diagnosis not present

## 2023-12-05 DIAGNOSIS — E042 Nontoxic multinodular goiter: Secondary | ICD-10-CM

## 2023-12-05 DIAGNOSIS — M1A00X0 Idiopathic chronic gout, unspecified site, without tophus (tophi): Secondary | ICD-10-CM

## 2023-12-05 DIAGNOSIS — Z96642 Presence of left artificial hip joint: Secondary | ICD-10-CM

## 2023-12-05 HISTORY — PX: TOTAL HIP ARTHROPLASTY: SHX124

## 2023-12-05 LAB — ABO/RH: ABO/RH(D): AB POS

## 2023-12-05 SURGERY — ARTHROPLASTY, HIP, TOTAL,POSTERIOR APPROACH
Anesthesia: Spinal | Site: Hip | Laterality: Left

## 2023-12-05 MED ORDER — OXYCODONE HCL 5 MG PO TABS
5.0000 mg | ORAL_TABLET | Freq: Once | ORAL | Status: AC | PRN
  Administered 2023-12-05: 5 mg via ORAL

## 2023-12-05 MED ORDER — PHENYLEPHRINE HCL-NACL 20-0.9 MG/250ML-% IV SOLN
INTRAVENOUS | Status: AC
  Filled 2023-12-05: qty 250

## 2023-12-05 MED ORDER — CEFAZOLIN SODIUM-DEXTROSE 2-4 GM/100ML-% IV SOLN
INTRAVENOUS | Status: AC
  Filled 2023-12-05: qty 100

## 2023-12-05 MED ORDER — ACETAMINOPHEN 10 MG/ML IV SOLN
1000.0000 mg | Freq: Four times a day (QID) | INTRAVENOUS | Status: DC
  Administered 2023-12-05 – 2023-12-06 (×3): 1000 mg via INTRAVENOUS
  Filled 2023-12-05 (×4): qty 100

## 2023-12-05 MED ORDER — GABAPENTIN 300 MG PO CAPS
300.0000 mg | ORAL_CAPSULE | Freq: Once | ORAL | Status: AC
  Administered 2023-12-05: 300 mg via ORAL

## 2023-12-05 MED ORDER — ENSURE PRE-SURGERY PO LIQD
296.0000 mL | Freq: Once | ORAL | Status: AC
Start: 2023-12-05 — End: 2023-12-05
  Administered 2023-12-05: 296 mL via ORAL
  Filled 2023-12-05: qty 296

## 2023-12-05 MED ORDER — PHENOL 1.4 % MT LIQD: 1.0000 | OROMUCOSAL | Status: DC | PRN

## 2023-12-05 MED ORDER — ORAL CARE MOUTH RINSE
15.0000 mL | Freq: Once | OROMUCOSAL | Status: AC
Start: 2023-12-05 — End: 2023-12-05

## 2023-12-05 MED ORDER — CHLORHEXIDINE GLUCONATE 0.12 % MT SOLN
OROMUCOSAL | Status: AC
  Filled 2023-12-05: qty 15

## 2023-12-05 MED ORDER — DEXAMETHASONE SODIUM PHOSPHATE 10 MG/ML IJ SOLN
8.0000 mg | Freq: Once | INTRAMUSCULAR | Status: AC
  Administered 2023-12-05: 8 mg via INTRAVENOUS

## 2023-12-05 MED ORDER — CEFAZOLIN SODIUM-DEXTROSE 2-4 GM/100ML-% IV SOLN
2.0000 g | Freq: Four times a day (QID) | INTRAVENOUS | Status: AC
  Administered 2023-12-05 (×2): 2 g via INTRAVENOUS
  Filled 2023-12-05 (×2): qty 100

## 2023-12-05 MED ORDER — GLYCOPYRROLATE 0.2 MG/ML IJ SOLN
INTRAMUSCULAR | Status: DC | PRN
  Administered 2023-12-05 (×2): .1 mg via INTRAVENOUS

## 2023-12-05 MED ORDER — METOCLOPRAMIDE HCL 10 MG PO TABS
10.0000 mg | ORAL_TABLET | Freq: Three times a day (TID) | ORAL | Status: DC
  Administered 2023-12-05 – 2023-12-06 (×3): 10 mg via ORAL
  Filled 2023-12-05 (×3): qty 1

## 2023-12-05 MED ORDER — ACETAMINOPHEN 10 MG/ML IV SOLN
INTRAVENOUS | Status: DC | PRN
Start: 2023-12-05 — End: 2023-12-05
  Administered 2023-12-05: 1000 mg via INTRAVENOUS

## 2023-12-05 MED ORDER — TRANEXAMIC ACID-NACL 1000-0.7 MG/100ML-% IV SOLN
INTRAVENOUS | Status: AC
Start: 2023-12-05 — End: 2023-12-05
  Filled 2023-12-05: qty 100

## 2023-12-05 MED ORDER — OXYCODONE HCL 5 MG PO TABS: 10.0000 mg | ORAL_TABLET | ORAL | Status: DC | PRN

## 2023-12-05 MED ORDER — TRANEXAMIC ACID-NACL 1000-0.7 MG/100ML-% IV SOLN
1000.0000 mg | INTRAVENOUS | Status: AC
  Administered 2023-12-05: 1000 mg via INTRAVENOUS

## 2023-12-05 MED ORDER — CEFAZOLIN SODIUM-DEXTROSE 2-4 GM/100ML-% IV SOLN
2.0000 g | INTRAVENOUS | Status: AC
  Administered 2023-12-05: 2 g via INTRAVENOUS

## 2023-12-05 MED ORDER — FERROUS SULFATE 325 (65 FE) MG PO TABS
325.0000 mg | ORAL_TABLET | Freq: Two times a day (BID) | ORAL | Status: DC
  Administered 2023-12-05 – 2023-12-06 (×2): 325 mg via ORAL
  Filled 2023-12-05 (×2): qty 1

## 2023-12-05 MED ORDER — CELECOXIB 200 MG PO CAPS
ORAL_CAPSULE | ORAL | Status: AC
  Filled 2023-12-05: qty 2

## 2023-12-05 MED ORDER — SURGIPHOR WOUND IRRIGATION SYSTEM - OPTIME
TOPICAL | Status: DC | PRN
  Administered 2023-12-05: 450 mL via TOPICAL

## 2023-12-05 MED ORDER — FENTANYL CITRATE (PF) 100 MCG/2ML IJ SOLN
INTRAMUSCULAR | Status: AC
  Filled 2023-12-05: qty 2

## 2023-12-05 MED ORDER — SODIUM CHLORIDE 0.9 % IV SOLN: INTRAVENOUS | Status: DC

## 2023-12-05 MED ORDER — FLEET ENEMA RE ENEM: 1.0000 | ENEMA | Freq: Once | RECTAL | Status: DC | PRN

## 2023-12-05 MED ORDER — GLYCOPYRROLATE 0.2 MG/ML IJ SOLN
INTRAMUSCULAR | Status: AC
Start: 2023-12-05 — End: 2023-12-05
  Filled 2023-12-05: qty 1

## 2023-12-05 MED ORDER — ACETAMINOPHEN 10 MG/ML IV SOLN
INTRAVENOUS | Status: AC
Start: 2023-12-05 — End: 2023-12-05
  Filled 2023-12-05: qty 100

## 2023-12-05 MED ORDER — TAMSULOSIN HCL 0.4 MG PO CAPS
0.4000 mg | ORAL_CAPSULE | Freq: Every day | ORAL | Status: DC
  Administered 2023-12-05 – 2023-12-06 (×2): 0.4 mg via ORAL
  Filled 2023-12-05 (×2): qty 1

## 2023-12-05 MED ORDER — 0.9 % SODIUM CHLORIDE (POUR BTL) OPTIME
TOPICAL | Status: DC | PRN
  Administered 2023-12-05: 1000 mL

## 2023-12-05 MED ORDER — PHENYLEPHRINE HCL-NACL 20-0.9 MG/250ML-% IV SOLN
INTRAVENOUS | Status: DC | PRN
  Administered 2023-12-05: 160 ug via INTRAVENOUS
  Administered 2023-12-05: 40 ug/min via INTRAVENOUS
  Administered 2023-12-05: 80 ug via INTRAVENOUS

## 2023-12-05 MED ORDER — PHENYLEPHRINE 80 MCG/ML (10ML) SYRINGE FOR IV PUSH (FOR BLOOD PRESSURE SUPPORT)
PREFILLED_SYRINGE | INTRAVENOUS | Status: DC | PRN
  Administered 2023-12-05 (×2): 80 ug via INTRAVENOUS

## 2023-12-05 MED ORDER — CELECOXIB 200 MG PO CAPS
400.0000 mg | ORAL_CAPSULE | Freq: Once | ORAL | Status: AC
  Administered 2023-12-05: 400 mg via ORAL

## 2023-12-05 MED ORDER — MIDAZOLAM HCL 5 MG/5ML IJ SOLN
INTRAMUSCULAR | Status: DC | PRN
Start: 2023-12-05 — End: 2023-12-05
  Administered 2023-12-05: 1 mg via INTRAVENOUS

## 2023-12-05 MED ORDER — LACTATED RINGERS IV SOLN: INTRAVENOUS | Status: DC

## 2023-12-05 MED ORDER — DEXAMETHASONE SODIUM PHOSPHATE 10 MG/ML IJ SOLN
INTRAMUSCULAR | Status: AC
  Filled 2023-12-05: qty 1

## 2023-12-05 MED ORDER — OXYCODONE HCL 5 MG PO TABS
ORAL_TABLET | ORAL | Status: AC
  Filled 2023-12-05: qty 1

## 2023-12-05 MED ORDER — ONDANSETRON HCL 4 MG PO TABS: 4.0000 mg | ORAL_TABLET | Freq: Four times a day (QID) | ORAL | Status: DC | PRN

## 2023-12-05 MED ORDER — OXYCODONE HCL 5 MG PO TABS: 5.0000 mg | ORAL_TABLET | ORAL | Status: DC | PRN

## 2023-12-05 MED ORDER — GABAPENTIN 300 MG PO CAPS
ORAL_CAPSULE | ORAL | Status: AC
  Filled 2023-12-05: qty 1

## 2023-12-05 MED ORDER — PROPOFOL 1000 MG/100ML IV EMUL
INTRAVENOUS | Status: AC
  Filled 2023-12-05: qty 100

## 2023-12-05 MED ORDER — BISACODYL 10 MG RE SUPP: 10.0000 mg | Freq: Every day | RECTAL | Status: DC | PRN

## 2023-12-05 MED ORDER — TRANEXAMIC ACID-NACL 1000-0.7 MG/100ML-% IV SOLN
INTRAVENOUS | Status: AC
  Filled 2023-12-05: qty 100

## 2023-12-05 MED ORDER — ALLOPURINOL 100 MG PO TABS
100.0000 mg | ORAL_TABLET | Freq: Every day | ORAL | Status: DC
  Administered 2023-12-05 – 2023-12-06 (×2): 100 mg via ORAL
  Filled 2023-12-05 (×2): qty 1

## 2023-12-05 MED ORDER — TRANEXAMIC ACID-NACL 1000-0.7 MG/100ML-% IV SOLN
1000.0000 mg | Freq: Once | INTRAVENOUS | Status: AC
  Administered 2023-12-05: 1000 mg via INTRAVENOUS

## 2023-12-05 MED ORDER — SENNOSIDES-DOCUSATE SODIUM 8.6-50 MG PO TABS
1.0000 | ORAL_TABLET | Freq: Two times a day (BID) | ORAL | Status: DC
  Administered 2023-12-05 – 2023-12-06 (×2): 1 via ORAL
  Filled 2023-12-05 (×2): qty 1

## 2023-12-05 MED ORDER — LIDOCAINE HCL (CARDIAC) PF 100 MG/5ML IV SOSY
PREFILLED_SYRINGE | INTRAVENOUS | Status: DC | PRN
  Administered 2023-12-05: 40 mg via INTRAVENOUS

## 2023-12-05 MED ORDER — LIDOCAINE HCL (PF) 2 % IJ SOLN
INTRAMUSCULAR | Status: AC
Start: 2023-12-05 — End: 2023-12-05
  Filled 2023-12-05: qty 5

## 2023-12-05 MED ORDER — CHLORHEXIDINE GLUCONATE 0.12 % MT SOLN
15.0000 mL | Freq: Once | OROMUCOSAL | Status: AC
  Administered 2023-12-05: 15 mL via OROMUCOSAL

## 2023-12-05 MED ORDER — MIDAZOLAM HCL 2 MG/2ML IJ SOLN
INTRAMUSCULAR | Status: AC
  Filled 2023-12-05: qty 2

## 2023-12-05 MED ORDER — ONDANSETRON HCL 4 MG/2ML IJ SOLN: 4.0000 mg | Freq: Four times a day (QID) | INTRAMUSCULAR | Status: DC | PRN

## 2023-12-05 MED ORDER — CHLORHEXIDINE GLUCONATE 4 % EX SOLN
60.0000 mL | Freq: Once | CUTANEOUS | Status: AC
Start: 2023-12-05 — End: 2023-12-05
  Administered 2023-12-05: 4 via TOPICAL

## 2023-12-05 MED ORDER — AMLODIPINE BESYLATE 10 MG PO TABS
5.0000 mg | ORAL_TABLET | Freq: Every day | ORAL | Status: DC
  Administered 2023-12-05 – 2023-12-06 (×2): 5 mg via ORAL
  Filled 2023-12-05 (×2): qty 1

## 2023-12-05 MED ORDER — BUPIVACAINE HCL (PF) 0.5 % IJ SOLN
INTRAMUSCULAR | Status: AC
  Filled 2023-12-05: qty 10

## 2023-12-05 MED ORDER — PANTOPRAZOLE SODIUM 40 MG PO TBEC
40.0000 mg | DELAYED_RELEASE_TABLET | Freq: Two times a day (BID) | ORAL | Status: DC
  Administered 2023-12-05 – 2023-12-06 (×2): 40 mg via ORAL
  Filled 2023-12-05 (×2): qty 1

## 2023-12-05 MED ORDER — DROPERIDOL 2.5 MG/ML IJ SOLN: 0.6250 mg | Freq: Once | INTRAMUSCULAR | Status: DC | PRN

## 2023-12-05 MED ORDER — PROPOFOL 500 MG/50ML IV EMUL
INTRAVENOUS | Status: DC | PRN
  Administered 2023-12-05: 50 ug/kg/min via INTRAVENOUS

## 2023-12-05 MED ORDER — OXYCODONE HCL 5 MG/5ML PO SOLN: 5.0000 mg | Freq: Once | ORAL | Status: AC | PRN

## 2023-12-05 MED ORDER — BUPIVACAINE HCL (PF) 0.5 % IJ SOLN
INTRAMUSCULAR | Status: DC | PRN
  Administered 2023-12-05: 3 mL via INTRATHECAL

## 2023-12-05 MED ORDER — MENTHOL 3 MG MT LOZG: 1.0000 | LOZENGE | OROMUCOSAL | Status: DC | PRN

## 2023-12-05 MED ORDER — SODIUM CHLORIDE 0.9 % IR SOLN
Status: DC | PRN
  Administered 2023-12-05: 3000 mL

## 2023-12-05 MED ORDER — DIPHENHYDRAMINE HCL 12.5 MG/5ML PO ELIX: 12.5000 mg | ORAL_SOLUTION | ORAL | Status: DC | PRN

## 2023-12-05 MED ORDER — CELECOXIB 200 MG PO CAPS
200.0000 mg | ORAL_CAPSULE | Freq: Two times a day (BID) | ORAL | Status: DC
  Administered 2023-12-05 – 2023-12-06 (×2): 200 mg via ORAL
  Filled 2023-12-05 (×2): qty 1

## 2023-12-05 MED ORDER — TRAMADOL HCL 50 MG PO TABS
50.0000 mg | ORAL_TABLET | ORAL | Status: DC | PRN
  Administered 2023-12-05 (×2): 100 mg via ORAL
  Filled 2023-12-05 (×2): qty 2

## 2023-12-05 MED ORDER — FENTANYL CITRATE (PF) 100 MCG/2ML IJ SOLN
25.0000 ug | INTRAMUSCULAR | Status: DC | PRN
  Administered 2023-12-05 (×5): 25 ug via INTRAVENOUS

## 2023-12-05 MED ORDER — ACETAMINOPHEN 325 MG PO TABS: 325.0000 mg | ORAL_TABLET | Freq: Four times a day (QID) | ORAL | Status: DC | PRN

## 2023-12-05 MED ORDER — ONDANSETRON HCL 4 MG/2ML IJ SOLN
INTRAMUSCULAR | Status: DC | PRN
  Administered 2023-12-05: 4 mg via INTRAVENOUS

## 2023-12-05 MED ORDER — ACETAMINOPHEN 10 MG/ML IV SOLN: 1000.0000 mg | Freq: Once | INTRAVENOUS | Status: DC | PRN

## 2023-12-05 MED ORDER — MAGNESIUM HYDROXIDE 400 MG/5ML PO SUSP
30.0000 mL | Freq: Every day | ORAL | Status: DC
  Administered 2023-12-06: 30 mL via ORAL
  Filled 2023-12-05: qty 30

## 2023-12-05 MED ORDER — HYDROMORPHONE HCL 1 MG/ML IJ SOLN: 0.5000 mg | INTRAMUSCULAR | Status: DC | PRN

## 2023-12-05 MED ORDER — ONDANSETRON HCL 4 MG/2ML IJ SOLN
INTRAMUSCULAR | Status: AC
  Filled 2023-12-05: qty 2

## 2023-12-05 MED ORDER — LIDOCAINE HCL (PF) 2 % IJ SOLN
INTRAMUSCULAR | Status: AC
  Filled 2023-12-05: qty 5

## 2023-12-05 MED ORDER — ALUM & MAG HYDROXIDE-SIMETH 200-200-20 MG/5ML PO SUSP: 30.0000 mL | ORAL | Status: DC | PRN

## 2023-12-05 MED ORDER — ASPIRIN 81 MG PO CHEW
81.0000 mg | CHEWABLE_TABLET | Freq: Two times a day (BID) | ORAL | Status: DC
  Administered 2023-12-05 – 2023-12-06 (×3): 81 mg via ORAL
  Filled 2023-12-05 (×3): qty 1

## 2023-12-05 SURGICAL SUPPLY — 48 items
BLADE CLIPPER SURG (BLADE) IMPLANT
BLADE SAW 90X25X1.19 OSCILLAT (BLADE) ×1 IMPLANT
BRUSH SCRUB EZ PLAIN DRY (MISCELLANEOUS) ×1 IMPLANT
CUP PINNACLE 100 SERIES 58MM (Hips) IMPLANT
DRAPE INCISE IOBAN 66X60 STRL (DRAPES) ×1 IMPLANT
DRAPE SHEET LG 3/4 BI-LAMINATE (DRAPES) ×1 IMPLANT
DRSG AQUACEL AG ADV 3.5X14 (GAUZE/BANDAGES/DRESSINGS) ×1 IMPLANT
DRSG MEPILEX SACRM 8.7X9.8 (GAUZE/BANDAGES/DRESSINGS) ×1 IMPLANT
DRSG TEGADERM 4X4.75 (GAUZE/BANDAGES/DRESSINGS) ×1 IMPLANT
DRSG XEROFORM 1X8 (GAUZE/BANDAGES/DRESSINGS) IMPLANT
DURAPREP 26ML APPLICATOR (WOUND CARE) ×2 IMPLANT
ELECT CAUTERY BLADE 6.4 (BLADE) ×1 IMPLANT
ELECTRODE REM PT RTRN 9FT ADLT (ELECTROSURGICAL) ×1 IMPLANT
EVACUATOR 1/8 PVC DRAIN (DRAIN) ×1 IMPLANT
FEMORAL STEM 12/14 TPR SZ4 HIP (Orthopedic Implant) IMPLANT
GAUZE XEROFORM 1X8 LF (GAUZE/BANDAGES/DRESSINGS) ×1 IMPLANT
GLOVE BIOGEL M STRL SZ7.5 (GLOVE) ×6 IMPLANT
GLOVE BIOGEL PI IND STRL 8 (GLOVE) ×1 IMPLANT
GLOVE SRG 8 PF TXTR STRL LF DI (GLOVE) ×1 IMPLANT
GOWN STRL REUS W/ TWL LRG LVL3 (GOWN DISPOSABLE) ×2 IMPLANT
GOWN STRL REUS W/ TWL XL LVL3 (GOWN DISPOSABLE) ×1 IMPLANT
GOWN TOGA ZIPPER T7+ PEEL AWAY (MISCELLANEOUS) ×1 IMPLANT
HANDLE YANKAUER SUCT OPEN TIP (MISCELLANEOUS) ×1 IMPLANT
HEAD M SROM 36MM PLUS 1.5 (Hips) IMPLANT
HOLDER FOLEY CATH W/STRAP (MISCELLANEOUS) ×1 IMPLANT
HOOD PEEL AWAY T7 (MISCELLANEOUS) ×1 IMPLANT
KIT PEG BOARD PINK (KITS) ×1 IMPLANT
KIT TURNOVER KIT A (KITS) ×1 IMPLANT
LINER MARATHON 10D 36M (Hips) IMPLANT
MANIFOLD NEPTUNE II (INSTRUMENTS) ×2 IMPLANT
NS IRRIG 500ML POUR BTL (IV SOLUTION) ×1 IMPLANT
PACK HIP PROSTHESIS (MISCELLANEOUS) ×1 IMPLANT
PENCIL SMOKE EVACUATOR COATED (MISCELLANEOUS) ×1 IMPLANT
PIN STEIN THRED 5/32 (Pin) ×1 IMPLANT
SOL .9 NS 3000ML IRR UROMATIC (IV SOLUTION) ×1 IMPLANT
SOLUTION IRRIG SURGIPHOR (IV SOLUTION) ×1 IMPLANT
SPONGE DRAIN TRACH 4X4 STRL 2S (GAUZE/BANDAGES/DRESSINGS) ×1 IMPLANT
STAPLER SKIN PROX 35W (STAPLE) ×1 IMPLANT
SUT ETHIBOND #5 BRAIDED 30INL (SUTURE) ×1 IMPLANT
SUT VIC AB 0 CT1 36 (SUTURE) ×2 IMPLANT
SUT VIC AB 1 CT1 36 (SUTURE) ×2 IMPLANT
SUT VIC AB 2-0 CT1 TAPERPNT 27 (SUTURE) ×1 IMPLANT
TAPE CLOTH 3X10 WHT NS LF (GAUZE/BANDAGES/DRESSINGS) ×1 IMPLANT
TIP FAN IRRIG PULSAVAC PLUS (DISPOSABLE) ×1 IMPLANT
TOWEL OR 17X26 4PK STRL BLUE (TOWEL DISPOSABLE) IMPLANT
TRAP FLUID SMOKE EVACUATOR (MISCELLANEOUS) ×1 IMPLANT
TRAY FOLEY MTR SLVR 16FR STAT (SET/KITS/TRAYS/PACK) ×1 IMPLANT
WATER STERILE IRR 1000ML POUR (IV SOLUTION) ×1 IMPLANT

## 2023-12-05 NOTE — Evaluation (Signed)
 Physical Therapy Evaluation Patient Details Name: Henry Shepard MRN: [id number] DOB: [birth year] Today's Date: 12/05/2023  History of Present Illness  Pt is a 83 y.o. male with PMH of arthritis, asthma, GERD, HTN, nephrolithiasis, and thyroid  disease who presents s/p L THA.  Clinical Impression  Pt was pleasant and motivated to participate during the session and put forth good effort throughout. Pt completed therex described below. Pt required CGA for STS and ambulation to chair with RW. Pt was educated on posterior hip precautions and VC's were utilized throughout all mobility tasks to adhere to precautions with good pt carryover. Pt remained steady throughout all mobility tasks and no LOBs occurred. Pt reported no adverse symptoms during the session with SpO2 and HR WNL throughout on room air. Pt will benefit from continued PT services upon discharge to safely address deficits listed in patient problem list for decreased caregiver assistance and eventual return to PLOF.      If plan is discharge home, recommend the following: A little help with walking and/or transfers;A lot of help with bathing/dressing/bathroom;Assistance with cooking/housework;Assist for transportation;Help with stairs or ramp for entrance   Can travel by private vehicle        Equipment Recommendations Rolling walker (2 wheels);BSC/3in1  Recommendations for Other Services       Functional Status Assessment Patient has had a recent decline in their functional status and demonstrates the ability to make significant improvements in function in a reasonable and predictable amount of time.     Precautions / Restrictions Precautions Precautions: Posterior Hip Precaution Booklet Issued: Yes (comment) (Included with HEP packet) Recall of Precautions/Restrictions: Intact Restrictions Weight Bearing Restrictions Per Provider Order: Yes LLE Weight Bearing Per Provider Order: Weight bearing as tolerated      Mobility  Bed  Mobility Overal bed mobility: Modified Independent             General bed mobility comments: Pt required use of bedrails to come to sitting EOB from supine, but no physical assistance required    Transfers Overall transfer level: Needs assistance Equipment used: Rolling walker (2 wheels) Transfers: Sit to/from Stand Sit to Stand: Contact guard assist           General transfer comment: VC's were utilized for foot placement before STS initiation with good pt carryover, pt remained steady throughout and no LOBs occured    Ambulation/Gait Ambulation/Gait assistance: Contact guard assist Gait Distance (Feet): 4 Feet Assistive device: Rolling walker (2 wheels) Gait Pattern/deviations: Step-through pattern, Decreased step length - left, Decreased stance time - left, Decreased weight shift to left Gait velocity: decreased     General Gait Details: Pt demonstrated decreased weight shift towards LLE throughout ambulation. VC's were utilized for proper gait sequencing with good pt carryover. Pt remained steady throughout and no LOBs occured  Careers information officer     Tilt Bed    Modified Rankin (Stroke Patients Only)       Balance Overall balance assessment: Needs assistance Sitting-balance support: Feet supported, No upper extremity supported Sitting balance-Leahy Scale: Good     Standing balance support: Bilateral upper extremity supported, During functional activity, Reliant on assistive device for balance Standing balance-Leahy Scale: Fair Standing balance comment: Pt reliant on RW to maintain balance in stance and during ambulation, but remained steady throughout and no LOBs occured  Pertinent Vitals/Pain Pain Assessment Pain Assessment: 0-10 Pain Score: 4  Pain Location: L hip Pain Descriptors / Indicators: Operative site guarding, Constant Pain Intervention(s): Monitored during session, RN gave  pain meds during session    Home Living Family/patient expects to be discharged to:: Private residence Living Arrangements: Children Available Help at Discharge: Family;Available 24 hours/day Type of Home: House Home Access: Stairs to enter Entrance Stairs-Rails: Right Entrance Stairs-Number of Steps: 3   Home Layout: One level Home Equipment: None Additional Comments: Pt expected to be d/c to daughter's residence; home living information provided based on daughter's residence   Prior Function Prior Level of Function : Independent/Modified Independent             Mobility Comments: Pt reported being independent with community distances without the use of an AD. Pt reported 0 fallis in the past 6 months. Pt stated that he was walking 5 miles/day.  ADLs Comments: Pt reported being independent with ADLs without the use of an AD.     Extremity/Trunk Assessment   Upper Extremity Assessment Upper Extremity Assessment: Overall WFL for tasks assessed    Lower Extremity Assessment Lower Extremity Assessment: Generalized weakness; BLE ankle strength, AROM, and sensation to light touch WNL       Communication   Communication Communication: No apparent difficulties    Cognition Arousal: Alert Behavior During Therapy: WFL for tasks assessed/performed   PT - Cognitive impairments: No apparent impairments                         Following commands: Intact       Cueing Cueing Techniques: Verbal cues, Gestural cues, Tactile cues, Visual cues     General Comments      Exercises Total Joint Exercises Ankle Circles/Pumps: AROM, Right, Left, Both, 10 reps Quad Sets: AROM, Right, Left, Both, 10 reps Gluteal Sets: AROM, Right, Left, Both, 10 reps Posterior hip precaution education and review during functional tasks  90 deg L turn training to prevent CKC L hip IR    Assessment/Plan    PT Assessment Patient needs continued PT services  PT Problem List Decreased  strength;Decreased mobility;Decreased range of motion;Decreased activity tolerance;Decreased balance;Pain       PT Treatment Interventions DME instruction;Therapeutic exercise;Gait training;Balance training;Stair training;Functional mobility training;Therapeutic activities;Patient/family education    PT Goals (Current goals can be found in the Care Plan section)  Acute Rehab PT Goals Patient Stated Goal: to get back to walking without pain PT Goal Formulation: With patient Time For Goal Achievement: 12/18/23 Potential to Achieve Goals: Good    Frequency BID     Co-evaluation               AM-PAC PT 6 Clicks Mobility  Outcome Measure Help needed turning from your back to your side while in a flat bed without using bedrails?: A Little Help needed moving from lying on your back to sitting on the side of a flat bed without using bedrails?: A Little Help needed moving to and from a bed to a chair (including a wheelchair)?: A Little Help needed standing up from a chair using your arms (e.g., wheelchair or bedside chair)?: A Little Help needed to walk in hospital room?: A Little Help needed climbing 3-5 steps with a railing? : A Lot 6 Click Score: 17    End of Session Equipment Utilized During Treatment: Gait belt Activity Tolerance: Patient tolerated treatment well Patient left: in chair;with chair alarm set;with call  bell/phone within reach Nurse Communication: Mobility status PT Visit Diagnosis: Unsteadiness on feet (R26.81);Muscle weakness (generalized) (M62.81);Pain;Difficulty in walking, not elsewhere classified (R26.2) Pain - Right/Left: Left Pain - part of body: Hip    Time: 8379-8295 PT Time Calculation (min) (ACUTE ONLY): 44 min   Charges:                 Leontine Ingles, SPT 12/05/23, 5:40 PM

## 2023-12-05 NOTE — Anesthesia Procedure Notes (Signed)
 Spinal  Patient location during procedure: OR Start time: 12/05/2023 7:17 AM End time: 12/05/2023 7:26 AM Reason for block: surgical anesthesia Staffing Performed: resident/CRNA  Resident/CRNA: Jackye Spanner, CRNA Performed by: Jackye Spanner, CRNA Authorized by: Myra Lynwood MATSU, MD   Preanesthetic Checklist Completed: patient identified, IV checked, site marked, risks and benefits discussed, surgical consent, monitors and equipment checked, pre-op evaluation and timeout performed Spinal Block Patient position: sitting Prep: ChloraPrep Patient monitoring: heart rate, continuous pulse ox and blood pressure Approach: midline Location: L4-5 Injection technique: single-shot Needle Needle type: Introducer and Pencan  Needle gauge: 24 G Needle length: 10 cm Assessment Sensory level: T10 Events: CSF return Additional Notes Negative paresthesia. Negative blood return. Positive free-flowing CSF. Expiration date of kit checked and confirmed. Patient tolerated procedure well, without complications. Successful on first attempt.

## 2023-12-05 NOTE — Transfer of Care (Signed)
 Immediate Anesthesia Transfer of Care Note  Patient: Henry Shepard  Procedure(s) Performed: ARTHROPLASTY, HIP, TOTAL,POSTERIOR APPROACH (Left: Hip)  Patient Location: PACU  Anesthesia Type:Spinal  Level of Consciousness: awake and drowsy  Airway & Oxygen Therapy: Patient Spontanous Breathing  Post-op Assessment: Report given to RN and Post -op Vital signs reviewed and stable  Post vital signs: Reviewed and stable  Last Vitals:  Vitals Value Taken Time  BP 131/75 12/05/23 10:41  Temp 35.8 1040  Pulse 75 12/05/23 10:42  Resp 14 12/05/23 10:42  SpO2 97 % 12/05/23 10:42  Vitals shown include unfiled device data.  Last Pain:  Vitals:   12/05/23 0622  TempSrc: Temporal  PainSc: 0-No pain         Complications: No notable events documented.

## 2023-12-05 NOTE — Interval H&P Note (Signed)
 History and Physical Interval Note:  12/05/2023 6:48 AM  Henry Shepard  has presented today for surgery, with the diagnosis of Primary osteoarthritis of left hip.  The various methods of treatment have been discussed with the patient and family. After consideration of risks, benefits and other options for treatment, the patient has consented to  Procedure(s): ARTHROPLASTY, HIP, TOTAL,POSTERIOR APPROACH (Left) as a surgical intervention.  The patient's history has been reviewed, patient examined, no change in status, stable for surgery.  I have reviewed the patient's chart and labs.  Questions were answered to the patient's satisfaction.     Walden Statz P Rhett Najera

## 2023-12-05 NOTE — H&P (Signed)
 ORTHOPAEDIC HISTORY & PHYSICAL Drake Fonda [place], [place] - 11/23/2023 11:00 AM EDT Formatting of this note is different from the original. NAME: Henry Shepard H&P Date: 11/23/2023 Procedure Date: 12/05/2023  Chief Complaint: left hip pain  HPI Henry Shepard is a 83 y.o. male who has severe Left hip pain. Patient has a several year history of left hip pain that is progressively gotten worse over the last year. He states that the pain localizes in the anterior portion of his groin and sometimes towards the back of his hip. He states that the pain is made worse with any attempted ambulation, standing or rotation of the hip. He states that he notices it grinding. It has greatly affected his ability to ambulate long distances and perform his ADLs as he would like, he uses the example of putting on his socks in the morning. He has failed conservative treatment including Tylenol , NSAIDs and activity modification. He is not currently utilizing any ambulatory aids. 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 He denies any previous cardiac issues. Does have a history of asthma. No previous clots or DVTs. Patient is not a diabetic. Denies any previous surgeries on this hip.  Social Hx: Patient lives at home alone, but states that he will be staying with his daughter for 2 weeks postoperatively to help look after him. He denies any alcohol, illicit drug or nicotine use. No history of smoking.  Medications & Allergies Allergies: Allergies Allergen Reactions Atorvastatin Rash  Home Medicines: Current Outpatient Medications on File Prior to Visit Medication Sig Dispense Refill acetaminophen  (TYLENOL ) 650 MG ER tablet Take 650 mg by mouth every 8 (eight) hours as needed for Pain allopurinoL  (ZYLOPRIM ) 100 MG tablet TAKE 1 TABLET BY MOUTH ONCE DAILY. 90 tablet 3 amLODIPine  (NORVASC ) 5 MG tablet Take 5 mg by mouth once daily cholecalciferol (VITAMIN D3) 1000 unit tablet Take 1,000 Units  by mouth once daily lisinopriL (ZESTRIL) 10 MG tablet TAKE 1 TABLET BY MOUTH ONCE DAILY. 100 tablet 0 meloxicam (MOBIC) 7.5 MG tablet Take 7.5 mg by mouth once daily omeprazole (PRILOSEC) 40 MG DR capsule TAKE 1 CAPSULE BY MOUTH EVERY DAY 90 capsule 4 sildenafil  (REVATIO ) 20 mg tablet TAKE 2-5 TABLETS EVERY DAY AS NEEDED FORSEXUAL ACTIVITY 90 tablet 3 tamsulosin  (FLOMAX ) 0.4 mg capsule TAKE ONE CAPSULE DAILY 30 MINUTES AFTER THE SAME MEAL EACH DAY 100 capsule 0  No current facility-administered medications on file prior to visit.  Medical / Surgical History  Past Medical History: Diagnosis Date Allergic state Arthritis Asthma without status asthmaticus (HHS-HCC) Dysphagia 10/20/2016 GERD without esophagitis 04/11/2017 Hypertension Nephrolithiasis 08/20/2016 Thyroid  disease   Past Surgical History: Procedure Laterality Date ESOPHAGOGASTRODOUDENOSCOPY W/DILATION 01/10/2017 Procedure: ESOPHAGOGASTRODUODENOSCOPY, FLEXIBLE, TRANSORAL; WITH TRANSENDOSCOPIC BALLOON DILATION OF ESOPHAGUS (LESS THAN 30 MM DIAMETER); Surgeon: Alm Veria Barcelona, MD; Location[facility] ENDO/[facility]; Service: Gastroenterology;; COLONOSCOPY W/BIOPSY 01/10/2017 Procedure: COLONOSCOPY, FLEXIBLE; WITH BIOPSY, SINGLE OR MULTIPLE; Surgeon: Alm Veria Barcelona, MD; Location: [facility] ENDO[facility]; Service: Gastroenterology;; COLONOSCOPY W/REMOVAL LESIONS BY SNARE 01/10/2017 Procedure: COLONOSCOPY, FLEXIBLE; WITH REMOVAL OF TUMOR(S), POLYP(S), OR OTHER LESION(S) BY SNARE TECHNIQUE; Surgeon: Alm Veria Barcelona, MD; Location: [facility] ENDO/BRONCH; Service: Gastroenterology;; COLONOSCOPY W/INJECTION 01/10/2017 Procedure: COLONOSCOPY, FLEXIBLE; WITH DIRECTED SUBMUCOSAL INJECTION(S), ANY SUBSTANCE; Surgeon: Alm Veria Barcelona, MD; Location: [facility] ENDO/BRON[facility]; Service: Gastroenterology;; CORI W/REMOVAL LESION BY SNARE N/A 12/19/2019 Procedure: EGD with Dilation; Surgeon: Orval Elsie Ned, MD; Location: [facility]  ENDO/BRONCH; Service: Gastroenterology; Laterality: N/A; ESOPHAGOGASTRODOUDENOSCOPY W/BIOPSY N/A 12/19/2019 Procedure: ESOPHAGOGASTRODUODENOSCOPY, FLEXIBLE, TRANSORAL; WITH BIOPSY, SINGLE OR MULTIPLE; Surgeon: Orval,  Elsie Ned, MD; Location: [facility] ENDO/BRONCH; Service: Gastroenterology; Laterality: N/A; VASECTOMY   Physical Exam  Ht: Wt:85.7 kg (189 lb) BMI: Body mass index is 27.12 kg/m.  General/Constitutional: No apparent distress: well-nourished and well developed. Eyes: Pupils equal, round with synchronous movement. Lymphatic: No palpable adenopathy. Respiratory: Patient has good chest rise and fall with inspiration and expiration. All lung fields are clear to auscultation bilaterally. There is no Rales, rhonchi or wheezes appreciated. Cardiovascular: Upon auscultation there is a regular rate and rhythm without any murmurs, rubs, gallops or heaves appreciated. There does not appear to be any swelling down the lower extremities. Posterior tibial pulses appreciated bilaterally, 2+. Integumentary: No impressive skin lesions present, except as noted in detailed exam. Neuro/Psych: Normal mood and affect, oriented to person, place and time. Musculoskeletal: see exam below  Left hip exam Left Hip:  Upon inspection of the patient's left hip, there does not appear to be any noticeable open deformity laceration, swelling, erythema or gross deformity to the leg  Pelvic tilt: Negative Limb lengths: Equal with the patient standing Soft tissue swelling: Negative Erythema: Negative Crepitance: Negative Tenderness: Greater trochanter is nontender to palpation. Moderate pain is elicited by axial compression or extremes of rotation. Atrophy: No atrophy. Poor to fair hip flexor and abductor strength. Range of Motion: EXT/FLEX: 0/85 ADD/ABD: 10/10 IR/ER: 5/15  Patient is neurovascularly intact to all dermatomes extending down there Left lower extremity to all dermatomes. Posterior  tibial pulses were appreciated, 2+.  Imaging Hip Imaging: None ordered today. Previous images from 10/25/2023 were reviewed. 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 There are subchondral changes appreciated. Osteophyte formation present. Cystic changes both to the femoral head and acetabular cup along the superior margins. No fractures, lytic lesions or gross fomites appreciated on films.  Assesment and Plan Hip DJD  I have recommended that Henry Shepard undergo left total hip replacement. Consents has been signed. The risks, benefits, prognosis and alternatives including but not limited to DVT, PE, infection, neurovascular injury, failure of the procedure and death were explained to the patient and he is willing to proceed with surgery as described to him by myself. Plan will be for post operative admission of at least 1 midnight for pain control and PT. He will be managed with DVT prophylaxis, antibiotics preoperatively for 24 hours and aggressive in patient rehab.  Pre, intra and post op interventions were discussed. Patient has good understanding  Medication Reconciliation was performed. Discussed cessation of NSAIDs, vitamins and supplements.  A total of 45 minutes was spent reviewing patient's charts, medical reconciliation, discussing/educating the patient about surgical interventions, and answering any questions provided by the patient.  JOSHUA DALLAS KOYANAGI, PA [facility] 11/23/2023  Electronically signed by KOYANAGI Fonda DALLAS, PA at 11/23/2023 1:34 PM EDT

## 2023-12-05 NOTE — Anesthesia Preprocedure Evaluation (Signed)
 Anesthesia Evaluation  Patient identified by MRN, date of birth, ID band Patient awake    Reviewed: Allergy & Precautions, H&P , NPO status , Patient's Chart, lab work & pertinent test results, reviewed documented beta blocker date and time   Airway Mallampati: II   Neck ROM: full    Dental  (+) Poor Dentition   Pulmonary neg shortness of breath, asthma , neg recent URI   Pulmonary exam normal        Cardiovascular Exercise Tolerance: Poor hypertension, On Medications negative cardio ROS Normal cardiovascular exam Rhythm:regular Rate:Normal     Neuro/Psych negative neurological ROS  negative psych ROS   GI/Hepatic Neg liver ROS,GERD  Medicated,,  Endo/Other  negative endocrine ROS    Renal/GU Renal disease  negative genitourinary   Musculoskeletal   Abdominal   Peds  Hematology negative hematology ROS (+)   Anesthesia Other Findings Past Medical History: No date: Allergy No date: Asthma No date: BPH (benign prostatic hyperplasia) No date: ED (erectile dysfunction) No date: Elevated PSA No date: GERD (gastroesophageal reflux disease) No date: Gout 11/24/2017: Greater trochanteric bursitis of right hip No date: Gross hematuria No date: Hypertension No date: Hypertension No date: Kidney stone 07/09/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 11/24/2017: Primary osteoarthritis of both hips 04/11/2017: Schatzki's ring No date: Thyroid  disease No date: Urinary frequency Past Surgical History: 2018: COLONOSCOPY W/ BIOPSIES AND POLYPECTOMY 2018: ESOPHAGEAL DILATION No date: none BMI    Body Mass Index: 29.60 kg/m     Reproductive/Obstetrics negative OB ROS                              Anesthesia Physical Anesthesia Plan  ASA: 3  Anesthesia Plan: General and Spinal   Post-op Pain Management:    Induction:   PONV Risk Score and Plan: 3  Airway Management Planned:   Additional  Equipment:   Intra-op Plan:   Post-operative Plan:   Informed Consent: I have reviewed the patients History and Physical, chart, labs and discussed the procedure including the risks, benefits and alternatives for the proposed anesthesia with the patient or authorized representative who has indicated his/her understanding and acceptance.     Dental Advisory Given  Plan Discussed with: CRNA  Anesthesia Plan Comments:         Anesthesia Quick Evaluation

## 2023-12-05 NOTE — Op Note (Signed)
 OPERATIVE NOTE  DATE OF SURGERY:  12/05/2023  PATIENT NAME:  Henry Shepard   DOB: [birth year]  MRN: [id number]  PRE-OPERATIVE DIAGNOSIS: Degenerative arthrosis of the left hip, primary  POST-OPERATIVE DIAGNOSIS:  Same  PROCEDURE:  Left total hip arthroplasty  SURGEON:  Lynwood SHAUNNA Mardee Mickey. M.D.  ASSISTANT:  Sidra Koyanagi, PA-C (present and scrubbed throughout the case, critical for assistance with exposure, retraction, instrumentation, and closure)  ANESTHESIA: spinal  ESTIMATED BLOOD LOSS: 75 mL  FLUIDS REPLACED: 800 mL of crystalloid  DRAINS: 2 medium hemovac drains  IMPLANTS UTILIZED: DePuy size 4 high offset Actis femoral stem, 58 mm OD Pinnacle 100 acetabular component, +4 mm 10 degree Pinnacle Marathon polyethylene insert, and a 36 mm M-SPEC +1.5 mm hip ball  INDICATIONS FOR SURGERY: Henry Shepard is a 83 y.o. year old male with a long history of progressive hip and groin  pain. X-rays demonstrated severe degenerative changes. The patient had not seen any significant improvement despite conservative nonsurgical intervention. After discussion of the risks and benefits of surgical intervention, the patient expressed understanding of the risks benefits and agree with plans for total hip arthroplasty.   The risks, benefits, and alternatives were discussed at length including but not limited to the risks of infection, bleeding, nerve injury, stiffness, blood clots, the need for revision surgery, limb length inequality, dislocation, cardiopulmonary complications, among others, and they were willing to proceed.  PROCEDURE IN DETAIL: The patient was brought into the operating room and, after adequate spinal anesthesia was achieved, the patient was placed in a right lateral decubitus position. Axillary roll was placed and all bony prominences were well-padded. The patient's left hip was cleaned and prepped with alcohol and DuraPrep and draped in the usual sterile fashion. A timeout was  performed as per usual protocol. A lateral curvilinear incision was made gently curving towards the posterior superior iliac spine. The IT band was incised in line with the skin incision and the fibers of the gluteus maximus were split in line. The piriformis tendon was identified, skeletonized, and incised at its insertion to the proximal femur and reflected posteriorly. A T type posterior capsulotomy was performed. Prior to dislocation of the femoral head, a threaded Steinmann pin was inserted through a separate stab incision into the pelvis superior to the acetabulum and bent in the form of a stylus so as to assess limb length and hip offset throughout the procedure. The femoral head was then dislocated posteriorly. Inspection of the femoral head demonstrated severe degenerative changes with full-thickness loss of articular cartilage. The femoral neck cut was performed using an oscillating saw. The anterior capsule was elevated off of the femoral neck using a periosteal elevator. Attention was then directed to the acetabulum. The remnant of the labrum was excised using electrocautery. Inspection of the acetabulum also demonstrated significant degenerative changes. The acetabulum was reamed in sequential fashion up to a 57 mm diameter. Good punctate bleeding bone was encountered. A 58 mm Pinnacle 100 acetabular component was positioned and impacted into place. Good scratch fit was appreciated. A +4 neutral polyethylene trial was inserted.  Attention was then directed to the proximal femur.  Femoral broaches were inserted in a sequential fashion up to a size 4 broach. Calcar region was planed and a trial reduction was performed using a high offset neck and a 36 mm hip ball with a +1.5 mm neck length. Reasonably good stability was achieved but it was elected to trial with a +4 mm 10 degree liner  with the high side at the 4 o'clock position. Good equalization of limb lengths and hip offset was appreciated and  excellent stability was noted both anteriorly and posteriorly. Trial components were removed. The acetabular shell was irrigated with copious amounts of normal saline with antibiotic solution and suctioned dry. A +4 mm 10 degree Pinnacle Marathon polyethylene insert was positioned with the high side at the 4 o'clock position and impacted into place. Next, a size 4 high offset Actis femoral stem was positioned and impacted into place. Excellent scratch fit was appreciated. A trial reduction was again performed with a 36 mm hip ball with a +1.5 mm neck length. Again, good equalization of limb lengths was appreciated and excellent stability appreciated both anteriorly and posteriorly. The hip was then dislocated and the trial hip ball was removed. The Morse taper was cleaned and dried. A 36 mm M-SPEC hip ball with a +1.5 mm neck length was placed on the trunnion and impacted into place. The hip was then reduced and placed through range of motion. Excellent stability was appreciated both anteriorly and posteriorly.  The wound was irrigated with copious amounts of normal saline followed by 450 ml of Surgiphor and suctioned dry. Good hemostasis was appreciated. The posterior capsulotomy was repaired using #5 Ethibond. Piriformis tendon was reapproximated to the undersurface of the gluteus medius tendon using #5 Ethibond. The IT band was reapproximated using interrupted sutures of #1 Vicryl. Subcutaneous tissue was approximated using first #0 Vicryl followed by #2-0 Vicryl. The skin was closed with skin staples.  The patient tolerated the procedure well and was transported to the recovery room in stable condition.   Lynwood SHAUNNA Mardee Mickey., M.D.

## 2023-12-05 NOTE — Progress Notes (Signed)
 Subjective: 1 Day Post-Op Procedure(s) (LRB): ARTHROPLASTY, HIP, TOTAL,POSTERIOR APPROACH (Left) Patient reports pain as mild.   Patient seen in rounds with Dr. Mardee. Patient is well, and has had no acute complaints or problems Denies any CP, SOB, N/V, fevers or chills We will start therapy today.  Plan is to go Home after hospital stay.  Objective: Vital signs in last 24 hours: Temp:  [96.8 F (36 C)-98 F (36.7 C)] 98 F (36.7 C) (07/08 0749) Pulse Rate:  [58-83] 71 (07/08 0749) Resp:  [10-20] 17 (07/08 0749) BP: (97-147)/(64-97) 119/67 (07/08 0749) SpO2:  [90 %-100 %] 99 % (07/08 0749)  Intake/Output from previous day:  Intake/Output Summary (Last 24 hours) at 12/06/2023 0844 Last data filed at 12/06/2023 0429 Gross per 24 hour  Intake 3163.25 ml  Output 715 ml  Net 2448.25 ml    Intake/Output this shift: No intake/output data recorded.  Labs: No results for input(s): HGB in the last 72 hours. No results for input(s): WBC, RBC, HCT, PLT in the last 72 hours. No results for input(s): NA, K, CL, CO2, BUN, CREATININE, GLUCOSE, CALCIUM in the last 72 hours. No results for input(s): LABPT, INR in the last 72 hours.  EXAM General - Patient is Alert, Appropriate, and Oriented Extremity - Neurologically intact Neurovascular intact Sensation intact distally Intact pulses distally Dorsiflexion/Plantar flexion intact No cellulitis present Compartment soft Dressing - dressing C/D/I and no drainage Motor Function - intact, moving foot and toes well on exam. JP Drain pulled without difficulty. 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   Weight as of this encounter: 85.7 kg. Advance diet Up with therapy  Patient will continue to work with physical therapy to pass postoperative PT protocols, ROM and strengthening  Hip Preacutions  Discussed with the patient continuing to utilize ice over the bandage  Patient will wear TED hose bilaterally to help prevent DVT and clot formation  Discussed the Aquacel bandage.  This bandage will stay in place 7 days postoperatively.  Can be replaced with honeycomb bandages that will be sent home with the patient  Discussed sending the patient home with tramadol  and oxycodone  for as needed pain management.  Patient will also be sent home with Celebrex  to help with swelling and inflammation.  Patient will take an 81 mg aspirin  twice daily for DVT prophylaxis  JP drain removed without difficulty, intact  Weight-Bearing as tolerated to left leg  Patient will follow-up with [facility] clinic orthopedics in 6 weeks for re-imaging and reevaluation   Fonda Koyanagi, PA-C [facility] 12/06/2023, 8:44 AM

## 2023-12-05 NOTE — Progress Notes (Signed)
 Patient is not able to walk the distance required to go the bathroom, or he/she is unable to safely negotiate stairs required to access the bathroom.  A 3in1 BSC will alleviate this problem   Amenda Duclos P. [name] M.D.

## 2023-12-05 NOTE — Plan of Care (Signed)
  Problem: Education: Goal: Understanding of discharge needs will improve Outcome: Progressing   Problem: Activity: Goal: Ability to avoid complications of mobility impairment will improve Outcome: Progressing Goal: Ability to tolerate increased activity will improve Outcome: Progressing   Problem: Clinical Measurements: Goal: Postoperative complications will be avoided or minimized Outcome: Progressing   Problem: Pain Management: Goal: Pain level will decrease with appropriate interventions Outcome: Progressing   Problem: Skin Integrity: Goal: Will show signs of wound healing Outcome: Progressing

## 2023-12-05 NOTE — Discharge Summary (Signed)
 Physician Discharge Summary  Subjective: 1 Day Post-Op Procedure(s) (LRB): ARTHROPLASTY, HIP, TOTAL,POSTERIOR APPROACH (Left) Patient reports pain as mild.   Patient seen in rounds with Dr. Mardee. Patient is well, and has had no acute complaints or problems Denies any CP, SOB, N/V, fevers or chills We will start therapy today.  Patient is ready to go home  Physician Discharge Summary  Patient ID: Henry Shepard MRN: [id number] DOB/AGE: [birth year] 81 y.o.  Admit date: 12/05/2023 Discharge date: 12/06/2023  Admission Diagnoses:  Discharge Diagnoses:  Principal Problem:   Hx of total hip arthroplasty, left   Discharged Condition: good  Hospital Course: Patient presented to the hospital on 7/72025 for an elective left total hip arthroplasty performed by Dr. Mardee. Patient was given 1g of TXA and 2g of Ancef  prior to the procedure. he  tolerated the procedure well without any complications. See procedural note below for details. Postoperatively, the patient did very well. he  was able to pass PT protocols on post-op day one without any issues. JP drain was removed without any difficulty and was intact. he  was able to void his bladder without any difficulty. Physical exam was unremarkable. he  denies any SOB, CP, N/V, fevers or chills. Vital signs are stable. Patient is stable to discharge home.  PROCEDURE:  Left total hip arthroplasty   SURGEON:  Lynwood SHAUNNA Mardee Mickey. M.D.   ASSISTANT:  Sidra Koyanagi, PA-C (present and scrubbed throughout the case, critical for assistance with exposure, retraction, instrumentation, and closure)   ANESTHESIA: spinal   ESTIMATED BLOOD LOSS: 75 mL   FLUIDS REPLACED: 800 mL of crystalloid   DRAINS: 2 medium hemovac drains   IMPLANTS UTILIZED: DePuy size 4 high offset Actis femoral stem, 58 mm OD Pinnacle 100 acetabular component, +4 mm 10 degree Pinnacle Marathon polyethylene insert, and a 36 mm M-SPEC +1.5 mm hip ball  Treatments: None  Discharge  Exam: Blood pressure 119/67, pulse 71, temperature 98 F (36.7 C), temperature source Oral, resp. rate 17, height 5' 7 (1.702 m), weight 85.7 kg, SpO2 99%.   Disposition:    Allergies as of 12/06/2023       Reactions   Atorvastatin Dermatitis        Medication List     TAKE these medications    Acetaminophen  500 MG capsule Take 1,000 mg by mouth every 6 (six) hours as needed for moderate pain (pain score 4-6) or mild pain (pain score 1-3).   allopurinol  100 MG tablet Commonly known as: ZYLOPRIM  Take one tab at night for gout   amLODipine  5 MG tablet Commonly known as: NORVASC  Take one tab po every day for blood pressure   aspirin  81 MG chewable tablet Chew 1 tablet (81 mg total) by mouth 2 (two) times daily.   celecoxib  200 MG capsule Commonly known as: CELEBREX  Take 1 capsule (200 mg total) by mouth 2 (two) times daily.   cholecalciferol 25 MCG (1000 UNIT) tablet Commonly known as: VITAMIN D3 Take 1,000 Units by mouth daily.   multivitamin with minerals tablet Take 1 tablet by mouth daily. Men 50+   oxyCODONE  5 MG immediate release tablet Commonly known as: Oxy IR/ROXICODONE  Take 1 tablet (5 mg total) by mouth every 4 (four) hours as needed for moderate pain (pain score 4-6) (pain score 4-6).   tamsulosin  0.4 MG Caps capsule Commonly known as: FLOMAX  Take 1 capsule (0.4 mg total) by mouth daily.   traMADol  50 MG tablet Commonly known as: ULTRAM  Take 1-2 tablets (  50-100 mg total) by mouth every 4 (four) hours as needed for moderate pain (pain score 4-6).               Durable Medical Equipment  (From admission, onward)           Start     Ordered   12/05/23 1044  DME Walker rolling  Once       Question:  Patient needs a walker to treat with the following condition  Answer:  S/P total hip arthroplasty   12/05/23 1043   12/05/23 1044  DME Bedside commode  Once       Comments: Patient is not able to walk the distance required to go the  bathroom, or he/she is unable to safely negotiate stairs required to access the bathroom.  A 3in1 BSC will alleviate this problem  Question:  Patient needs a bedside commode to treat with the following condition  Answer:  S/P total hip arthroplasty   12/05/23 1043            Follow-up Information     Hooten, Lynwood SQUIBB, MD Follow up on 01/17/2024.   Specialty: Orthopedic Surgery Why: at 2:00pm Contact information: [address] [facility] [place] [address] [phone]                 Signed: Sidra Koyanagi 12/06/2023, 8:44 AM   Objective: Vital signs in last 24 hours: Temp:  [96.8 F (36 C)-98 F (36.7 C)] 98 F (36.7 C) (07/08 0749) Pulse Rate:  [58-83] 71 (07/08 0749) Resp:  [10-20] 17 (07/08 0749) BP: (97-147)/(64-97) 119/67 (07/08 0749) SpO2:  [90 %-100 %] 99 % (07/08 0749)  Intake/Output from previous day:  Intake/Output Summary (Last 24 hours) at 12/06/2023 0844 Last data filed at 12/06/2023 0429 Gross per 24 hour  Intake 3163.25 ml  Output 715 ml  Net 2448.25 ml    Intake/Output this shift: No intake/output data recorded.  Labs: No results for input(s): HGB in the last 72 hours. No results for input(s): WBC, RBC, HCT, PLT in the last 72 hours. No results for input(s): NA, K, CL, CO2, BUN, CREATININE, GLUCOSE, CALCIUM in the last 72 hours. No results for input(s): LABPT, INR in the last 72 hours.  EXAM: General - Patient is Alert, Appropriate, and Oriented Extremity - Neurologically intact Neurovascular intact Sensation intact distally Intact pulses distally Dorsiflexion/Plantar flexion intact No cellulitis present Compartment soft Dressing - dressing C/D/I and no drainage Motor Function - intact, moving foot and toes well on exam. JP Drain pulled without difficulty. 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   Weight  as of this encounter: 85.7 kg.  Patient will continue to work with physical therapy   Hip Preacutions   Discussed with the patient continuing to utilize ice over the bandage   Patient will wear TED hose bilaterally to help prevent DVT and clot formation   Discussed the Aquacel bandage.  This bandage will stay in place 7 days postoperatively.  Can be replaced with honeycomb bandages that will be sent home with the patient   Discussed sending the patient home with tramadol  and oxycodone  for as needed pain management.  Patient will also be sent home with Celebrex  to help with swelling and inflammation.  Patient will take an 81 mg aspirin  twice daily for DVT prophylaxis   JP drain removed without difficulty, intact   Weight-Bearing as tolerated to left leg   Patient will follow-up with [facility] orthopedics in 6 weeks for re-imaging and reevaluation    Diet - Regular diet Follow up - in 6 weeks Activity - WBAT Disposition - Home Condition Upon Discharge - Good DVT Prophylaxis - Aspirin  and TED hose  Fonda CHARLENA Koyanagi, PA-C Orthopaedic Surgery 12/06/2023, 8:44 AM

## 2023-12-06 ENCOUNTER — Encounter: Payer: Self-pay | Admitting: Orthopedic Surgery

## 2023-12-06 DIAGNOSIS — M1612 Unilateral primary osteoarthritis, left hip: Secondary | ICD-10-CM | POA: Diagnosis not present

## 2023-12-06 MED ORDER — OXYCODONE HCL 5 MG PO TABS: 5.0000 mg | ORAL_TABLET | ORAL | 0 refills | Status: DC | PRN

## 2023-12-06 MED ORDER — CELECOXIB 200 MG PO CAPS
200.0000 mg | ORAL_CAPSULE | Freq: Two times a day (BID) | ORAL | 1 refills | Status: DC
Start: 2023-12-06 — End: 2024-01-31

## 2023-12-06 MED ORDER — ASPIRIN 81 MG PO CHEW: 81.0000 mg | CHEWABLE_TABLET | Freq: Two times a day (BID) | ORAL | Status: DC

## 2023-12-06 MED ORDER — TRAMADOL HCL 50 MG PO TABS: 50.0000 mg | ORAL_TABLET | ORAL | 0 refills | Status: DC | PRN

## 2023-12-06 NOTE — Progress Notes (Signed)
 DISCHARGE NOTE:  Pt and daughter given discharge instructions, scripts and 2 honeycomb dressings. TED hose on both legs.BSC and walker sent with pt. Pt wheeled to car by staff, daughter providing transportation .

## 2023-12-06 NOTE — Evaluation (Signed)
 Occupational Therapy Evaluation Patient Details Name: Henry Shepard MRN: [id number] DOB: 13-Jun-1940 Today's Date: 12/06/2023   History of Present Illness   Pt is a 83 y.o. male with PMH of arthritis, asthma, GERD, HTN, nephrolithiasis, and thyroid  disease who presents s/p L THA.     Clinical Impressions Pt was seen for OT evaluation this date. PTA, pt was IND and active. He plans to DC to his daughter's home where he will have 24/7 assist/supervision. Pt presents to acute OT demonstrating impaired ADL performance and functional mobility 2/2 pain, weakness, balance deficits and limited ROM/restrictions following hip surgery. Pt currently requires Mod A for LB ADL performance using reacher, sock aide and shoe horn with cueing for maintaining posterior hip precautions. He required set up assist for UB ADLs. Pt recalled 1/3 hip precautions. Him and his daughter were re-educated on hip precautions and educated on use of all DME/AE/AD to safely perform ADLs, as well as compression stockings management and other tasks. Handout provided to promote carryover. Pt would benefit from skilled OT services to address noted impairments and functional limitations to maximize safety and independence while minimizing falls risk and caregiver burden. Do anticipate the need for follow up OT services upon acute hospital DC.      If plan is discharge home, recommend the following:   A little help with walking and/or transfers;A little help with bathing/dressing/bathroom;Assistance with cooking/housework;Help with stairs or ramp for entrance     Functional Status Assessment   Patient has had a recent decline in their functional status and demonstrates the ability to make significant improvements in function in a reasonable and predictable amount of time.     Equipment Recommendations   BSC/3in1;Other (comment) (RW)     Recommendations for Other Services         Precautions/Restrictions    Precautions Precautions: Posterior Hip Precaution Booklet Issued: Yes (comment) Recall of Precautions/Restrictions: Intact Restrictions Weight Bearing Restrictions Per Provider Order: Yes LLE Weight Bearing Per Provider Order: Weight bearing as tolerated     Mobility Bed Mobility               General bed mobility comments: NT up in recliner pre/post session    Transfers Overall transfer level: Modified independent Equipment used: Rolling walker (2 wheels) Transfers: Sit to/from Stand Sit to Stand: Supervision           General transfer comment: cueing for hand and feet placement for STS from recliner      Balance Overall balance assessment: Needs assistance Sitting-balance support: Feet supported, No upper extremity supported Sitting balance-Leahy Scale: Good     Standing balance support: During functional activity, Reliant on assistive device for balance, Bilateral upper extremity supported Standing balance-Leahy Scale: Fair Standing balance comment: impulsivity noted; pt to DC home with daughter and 24/7 assist/supervision                           ADL either performed or assessed with clinical judgement   ADL Overall ADL's : Needs assistance/impaired                 Upper Body Dressing : Set up;Sitting   Lower Body Dressing: Moderate assistance;Sit to/from stand;Sitting/lateral leans;With adaptive equipment;Cueing for safety Lower Body Dressing Details (indicate cue type and reason): following post hip precautions                     Vision  Perception         Praxis         Pertinent Vitals/Pain Pain Assessment Pain Assessment: 0-10 Pain Score: 6  Pain Location: L hip Pain Descriptors / Indicators: Operative site guarding, Constant Pain Intervention(s): Monitored during session, Repositioned, Limited activity within patient's tolerance     Extremity/Trunk Assessment Upper Extremity Assessment Upper  Extremity Assessment: Overall WFL for tasks assessed   Lower Extremity Assessment Lower Extremity Assessment: Generalized weakness       Communication Communication Communication: No apparent difficulties   Cognition Arousal: Alert Behavior During Therapy: WFL for tasks assessed/performed                                 Following commands: Intact       Cueing  General Comments   Cueing Techniques: Verbal cues;Tactile cues  reviewed hip precautions, positioning, impotrance of routine mobility and HEP performance, Boith pt and pt's spouse state confidenec in safe DC home today.   Exercises Other Exercises Other Exercises: Edu on role of OT, use of DME, AE/AD for ADL performance, hip precautions and safety during dynamic standing activities.   Shoulder Instructions      Home Living Family/patient expects to be discharged to:: Private residence Living Arrangements: Children Available Help at Discharge: Family;Available 24 hours/day Type of Home: House Home Access: Stairs to enter [employer] of Steps: 3 Entrance Stairs-Rails: Right Home Layout: One level     Bathroom Shower/Tub: Chief Strategy Officer: Standard     Home Equipment: None   Additional Comments: Pt expected to be d/c to daughter's residence      Prior Functioning/Environment Prior Level of Function : Independent/Modified Independent             Mobility Comments: Pt reported being independent with community distances without the use of an AD. Pt reported 0 fallis in the past 6 months ADLs Comments: Pt reported being independent with ADLs without the use of an AD.    OT Problem List: Impaired balance (sitting and/or standing);Decreased activity tolerance;Decreased strength;Decreased range of motion;Pain   OT Treatment/Interventions: Self-care/ADL training;Therapeutic exercise;Therapeutic activities;DME and/or AE instruction;Patient/family education;Balance  training      OT Goals(Current goals can be found in the care plan section)   Acute Rehab OT Goals Patient Stated Goal: go home OT Goal Formulation: With patient/family Time For Goal Achievement: 12/20/23 Potential to Achieve Goals: Good ADL Goals Pt Will Perform Lower Body Bathing: with contact guard assist;with supervision;with adaptive equipment;sitting/lateral leans;sit to/from stand Pt Will Perform Lower Body Dressing: with contact guard assist;sit to/from stand;sitting/lateral leans;with adaptive equipment Pt Will Transfer to Toilet: with supervision;ambulating;regular height toilet;bedside commode;grab bars Pt Will Perform Toileting - Clothing Manipulation and hygiene: with supervision;sit to/from stand;sitting/lateral leans   OT Frequency:  Min 2X/week    Co-evaluation              AM-PAC OT 6 Clicks Daily Activity     Outcome Measure Help from another person eating meals?: None Help from another person taking care of personal grooming?: None Help from another person toileting, which includes using toliet, bedpan, or urinal?: A Little Help from another person bathing (including washing, rinsing, drying)?: A Lot Help from another person to put on and taking off regular upper body clothing?: None Help from another person to put on and taking off regular lower body clothing?: A Lot 6 Click Score: 19   End  of Session Nurse Communication: Mobility status  Activity Tolerance: Patient tolerated treatment well Patient left: in chair;with call bell/phone within reach;with family/visitor present  OT Visit Diagnosis: Other abnormalities of gait and mobility (R26.89)                Time: 9044-8966 OT Time Calculation (min): 38 min Charges:  OT General Charges $OT Visit: 1 Visit OT Evaluation $OT Eval Moderate Complexity: 1 Mod OT Treatments $Self Care/Home Management : 23-37 mins  Kariss Longmire, OTR/L 12/06/23, 12:16 PM Tamarick Kovalcik E Cinsere Mizrahi 12/06/2023, 12:12 PM

## 2023-12-06 NOTE — Care Management Obs Status (Signed)
 MEDICARE OBSERVATION STATUS NOTIFICATION   Patient Details  Name: GREGOR DERSHEM MRN: [id number] Date of Birth: [birth year]   Medicare Observation Status Notification Given:  Yes    Rojelio SHAUNNA Rattler 12/06/2023, 11:35 AM

## 2023-12-06 NOTE — Plan of Care (Signed)
  Problem: Activity: Goal: Ability to tolerate increased activity will improve Outcome: Progressing   Problem: Pain Management: Goal: Pain level will decrease with appropriate interventions Outcome: Progressing   

## 2023-12-06 NOTE — Anesthesia Postprocedure Evaluation (Signed)
 Anesthesia Post Note  Patient: Henry Shepard  Procedure(s) Performed: ARTHROPLASTY, HIP, TOTAL,POSTERIOR APPROACH (Left: Hip)  Patient location during evaluation: Nursing Unit Anesthesia Type: Spinal Level of consciousness: oriented and awake and alert Pain management: pain level controlled Vital Signs Assessment: post-procedure vital signs reviewed and stable Respiratory status: spontaneous breathing and respiratory function stable Cardiovascular status: blood pressure returned to baseline and stable Postop Assessment: no headache, no backache, no apparent nausea or vomiting and patient able to bend at knees Anesthetic complications: no   No notable events documented.   Last Vitals:  Vitals:   12/05/23 2325 12/06/23 0427  BP: 97/64 115/69  Pulse: 72 72  Resp: 18 16  Temp: (!) 36.4 C (!) 36.2 C  SpO2: 98% 99%    Last Pain:  Vitals:   12/06/23 0427  TempSrc:   PainSc: 1                  Delicia Berens

## 2023-12-06 NOTE — TOC Progression Note (Addendum)
 Transition of Care [facility]) - Progression Note    Patient Details  Name: Henry Shepard MRN: [id number] Date of Birth: September 05, 1940  Transition of Care [facility]) CM/SW Contact  [place] Alvah G[place], [place] Phone Number: 12/06/2023, 8:00 AM  Clinical Narrative:     CSW placed DME orders with [employer]. [place] , [place] was contacted and advised that patient will discharge to his daughter's home. Contact information for the daughter was provided.   Home health set up by surgeon's office prior to surgery.      Expected Discharge Plan and Services                                               Social Determinants of Health (SDOH) Interventions SDOH Screenings   Food Insecurity: No Food Insecurity (12/05/2023)  Housing: Low Risk  (12/05/2023)  Transportation Needs: No Transportation Needs (12/05/2023)  Utilities: Not At Risk (12/05/2023)  Depression (PHQ2-9): Low Risk  (10/11/2023)  Financial Resource Strain: Low Risk  (11/23/2023)   Received from [facility]  Social Connections: Moderately Isolated (12/05/2023)  Tobacco Use: Low Risk  (12/05/2023)    Readmission Risk Interventions     No data to display

## 2023-12-06 NOTE — Progress Notes (Signed)
 Physical Therapy Treatment Patient Details Name: Henry Shepard MRN: [id number] DOB: November 26, 1940 Today's Date: 12/06/2023   History of Present Illness Pt is a 83 y.o. male with PMH of arthritis, asthma, GERD, HTN, nephrolithiasis, and thyroid  disease who presents s/p L THA.    PT Comments  Pt was long sitting in bed upon arrival with supportive daughter and grandson present. Pt endorses no pain at rest that elevated to 5-6/10 during ambulation + wt bearing. Pain did not limit session progression. He did require a little assistance to exit bed however once seated EOB, did not require physical assistance to stand or ambulate. Pt performed ascending/descending stairs to simulate pt's daughters house. Both state confidence in abilities to enter exit home. Authro reviewed hip precautions, importance of positioning, Icing, and routine mobility. Both pt and pt's daughter state confidence in safe DC home later this date. Recommend continued skilled PT at DC to maximize independence wand safety with all ADLs.      If plan is discharge home, recommend the following: A little help with walking and/or transfers;A lot of help with bathing/dressing/bathroom;Assistance with cooking/housework;Assist for transportation;Help with stairs or ramp for entrance     Equipment Recommendations  Rolling walker (2 wheels);BSC/3in1       Precautions / Restrictions Precautions Precautions: Posterior Hip Precaution Booklet Issued: Yes (comment) Recall of Precautions/Restrictions: Intact Restrictions Weight Bearing Restrictions Per Provider Order: Yes LLE Weight Bearing Per Provider Order: Weight bearing as tolerated     Mobility  Bed Mobility Overal bed mobility: Needs Assistance Bed Mobility: Sidelying to Sit, Supine to Sit  Supine to sit: Min assist   Transfers Overall transfer level: Modified independent Equipment used: Rolling walker (2 wheels) Transfers: Sit to/from Stand Sit to Stand: Supervision    Ambulation/Gait Ambulation/Gait assistance: Supervision, Contact guard assist Gait Distance (Feet): 200 Feet Assistive device: Rolling walker (2 wheels) Gait Pattern/deviations: Step-through pattern Gait velocity: decreased  General Gait Details: no LOB however pt is reliant on RW does stagger R/L when distracted. Issued gait belt for home use. Encouraged to to slow down   Stairs Stairs: Yes Stairs assistance: Contact guard assist Stair Management: One rail Right, Step to pattern Number of Stairs: 4 General stair comments: pt performed up/down stairs with +1 R rail + step to pattern. performed 2 x. both pt/pt's daughter state confidenece in abilities and will be able to perform at home     Balance Overall balance assessment: Needs assistance Sitting-balance support: Feet supported, No upper extremity supported Sitting balance-Leahy Scale: Good     Standing balance support: During functional activity, Reliant on assistive device for balance, Bilateral upper extremity supported Standing balance-Leahy Scale: Fair Standing balance comment: slight fall risk due to impulsivity at times. Daughter will be with pt 24/7 and states she feels he is safe to DC today       Communication Communication Communication: No apparent difficulties  Cognition Arousal: Alert Behavior During Therapy: WFL for tasks assessed/performed   PT - Cognitive impairments: No apparent impairments    PT - Cognition Comments: Pt is A and O x 4. slightly impulsiove at times. will have 24/7 assistance at DC Following commands: Intact      Cueing Cueing Techniques: Verbal cues, Tactile cues     General Comments General comments (skin integrity, edema, etc.): reviewed hip precautions, positioning, impotrance of routine mobility and HEP performance, Boith pt and pt's spouse state confidenec in safe DC home today.      Pertinent Vitals/Pain Pain Assessment Pain Assessment:  0-10 Pain Score: 5  Pain Location:  L hip Pain Descriptors / Indicators: Operative site guarding, Constant Pain Intervention(s): Limited activity within patient's tolerance, Monitored during session, Premedicated before session, Repositioned, Ice applied     PT Goals (current goals can now be found in the care plan section) Acute Rehab PT Goals Patient Stated Goal: go to my daughgters Progress towards PT goals: Progressing toward goals    Frequency    BID       AM-PAC PT 6 Clicks Mobility   Outcome Measure  Help needed turning from your back to your side while in a flat bed without using bedrails?: A Little Help needed moving from lying on your back to sitting on the side of a flat bed without using bedrails?: A Little Help needed moving to and from a bed to a chair (including a wheelchair)?: A Little Help needed standing up from a chair using your arms (e.g., wheelchair or bedside chair)?: A Little Help needed to walk in hospital room?: A Little Help needed climbing 3-5 steps with a railing? : A Little 6 Click Score: 18    End of Session   Activity Tolerance: Patient tolerated treatment well Patient left: in chair;with call bell/phone within reach;with chair alarm set;with family/visitor present Nurse Communication: Mobility status PT Visit Diagnosis: Unsteadiness on feet (R26.81) Pain - Right/Left: Left Pain - part of body: Hip     Time: 9095-9068 PT Time Calculation (min) (ACUTE ONLY): 27 min  Charges:    $Gait Training: 8-22 mins $Therapeutic Activity: 8-22 mins PT General Charges $$ ACUTE PT VISIT: 1 Visit                    Rankin Essex PTA 12/06/23, 9:45 AM

## 2023-12-20 ENCOUNTER — Ambulatory Visit: Admitting: Internal Medicine

## 2023-12-29 ENCOUNTER — Telehealth: Payer: Self-pay | Admitting: Internal Medicine

## 2023-12-29 NOTE — Telephone Encounter (Signed)
 Received commode order from [facility]. Gave to dfk for signature-Toni

## 2024-01-02 ENCOUNTER — Telehealth: Payer: Self-pay | Admitting: Internal Medicine

## 2024-01-02 NOTE — Telephone Encounter (Signed)
 commode order signed, faxed back to  [facility]; [phone]. Scanned-Toni

## 2024-01-03 ENCOUNTER — Ambulatory Visit: Admitting: Internal Medicine

## 2024-01-31 ENCOUNTER — Encounter: Payer: Self-pay | Admitting: Internal Medicine

## 2024-01-31 ENCOUNTER — Ambulatory Visit: Admitting: Internal Medicine

## 2024-01-31 VITALS — BP 142/78 | HR 68 | Temp 98.3°F | Resp 16 | Ht 71.0 in | Wt 203.0 lb

## 2024-01-31 DIAGNOSIS — N138 Other obstructive and reflux uropathy: Secondary | ICD-10-CM

## 2024-01-31 DIAGNOSIS — R944 Abnormal results of kidney function studies: Secondary | ICD-10-CM | POA: Diagnosis not present

## 2024-01-31 DIAGNOSIS — Z96642 Presence of left artificial hip joint: Secondary | ICD-10-CM

## 2024-01-31 DIAGNOSIS — N401 Enlarged prostate with lower urinary tract symptoms: Secondary | ICD-10-CM

## 2024-01-31 DIAGNOSIS — I10 Essential (primary) hypertension: Secondary | ICD-10-CM

## 2024-01-31 NOTE — Progress Notes (Signed)
 [facility] [address] [place], [place] [address]  Internal MEDICINE  Office Visit Note  Patient Name: Henry Shepard  [id number]  [id number]  Date of Service: 01/31/2024  Chief Complaint  Patient presents with   Follow-up   Gastroesophageal Reflux   Hypertension   Quality Metric Gaps    AWV    HPI  Pt is seen for routine follow up Had left hip arthoplasty, doing well Bp is under better control Had CKD, off of all NSAID    Current Medication: Outpatient Encounter Medications as of 01/31/2024  Medication Sig   Acetaminophen  500 MG capsule Take 1,000 mg by mouth every 6 (six) hours as needed for moderate pain (pain score 4-6) or mild pain (pain score 1-3).   allopurinol  (ZYLOPRIM ) 100 MG tablet Take one tab at night for gout   amLODipine  (NORVASC ) 5 MG tablet Take one tab po every day for blood pressure   aspirin  81 MG chewable tablet Chew 1 tablet (81 mg total) by mouth 2 (two) times daily.   cholecalciferol (VITAMIN D3) 25 MCG (1000 UNIT) tablet Take 1,000 Units by mouth daily.   Multiple Vitamins-Minerals (MULTIVITAMIN WITH MINERALS) tablet Take 1 tablet by mouth daily. Men 50+   tamsulosin  (FLOMAX ) 0.4 MG CAPS capsule Take 1 capsule (0.4 mg total) by mouth daily.   [DISCONTINUED] celecoxib  (CELEBREX ) 200 MG capsule Take 1 capsule (200 mg total) by mouth 2 (two) times daily.   [DISCONTINUED] oxyCODONE  (OXY IR/ROXICODONE ) 5 MG immediate release tablet Take 1 tablet (5 mg total) by mouth every 4 (four) hours as needed for moderate pain (pain score 4-6) (pain score 4-6).   [DISCONTINUED] traMADol  (ULTRAM ) 50 MG tablet Take 1-2 tablets (50-100 mg total) by mouth every 4 (four) hours as needed for moderate pain (pain score 4-6).   No facility-administered encounter medications on file as of 01/31/2024.    Surgical History: Past Surgical History:  Procedure Laterality Date   COLONOSCOPY W/ BIOPSIES AND POLYPECTOMY  2018   ESOPHAGEAL DILATION  2018   none     TOTAL  HIP ARTHROPLASTY Left 12/05/2023   Procedure: ARTHROPLASTY, HIP, TOTAL,POSTERIOR APPROACH;  Surgeon: Mardee Lynwood SQUIBB, MD;  Location: [facility] ORS;  Service: Orthopedics;  Laterality: Left;    Medical History: Past Medical History:  Diagnosis Date   Allergy    Asthma    BPH (benign prostatic hyperplasia)    ED (erectile dysfunction)    Elevated PSA    GERD (gastroesophageal reflux disease)    Gout    Greater trochanteric bursitis of right hip 11/24/2017   Gross hematuria    Hypertension    Hypertension    Kidney stone    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   Schatzki's ring 04/11/2017   Thyroid  disease    Urinary frequency     Family History: Family History  Problem Relation Age of Onset   Kidney cancer Neg Hx    Kidney disease Neg Hx    Prostate cancer Neg Hx    Bladder Cancer Neg Hx     Social History   Socioeconomic History   Marital status: Divorced    Spouse name: Not on file   Number of children: Not on file   Years of education: Not on file   Highest education level: Not on file  Occupational History   Not on file  Tobacco Use   Smoking status: Never   Smokeless tobacco: Never  Vaping Use  Vaping status: Never Used  Substance and Sexual Activity   Alcohol use: No    Alcohol/week: 0.0 standard drinks of alcohol   Drug use: No   Sexual activity: Yes  Other Topics Concern   Not on file  Social History Narrative   Not on file   Social Drivers of Health   Financial Resource Strain: Low Risk  (11/23/2023)   Received from [facility]   Overall Financial Resource Strain (CARDIA)    Difficulty of Paying Living Expenses: Not hard at all  Food Insecurity: No Food Insecurity (12/05/2023)   Hunger Vital Sign    Worried About Running Out of Food in the Last Year: Never true    Ran Out of Food in the Last Year: Never true  Transportation Needs: No Transportation Needs (12/05/2023)   PRAPARE -  Administrator, Civil Service (Medical): No    Lack of Transportation (Non-Medical): No  Physical Activity: Not on file  Stress: Not on file  Social Connections: Moderately Isolated (12/05/2023)   Social Connection and Isolation Panel    Frequency of Communication with Friends and Family: Three times a week    Frequency of Social Gatherings with Friends and Family: Once a week    Attends Religious Services: More than 4 times per year    Active Member of [employer] or Organizations: No    Attends Banker Meetings: Never    Marital Status: Divorced  Catering manager Violence: Not At Risk (12/05/2023)   Humiliation, Afraid, Rape, and Kick questionnaire    Fear of Current or Ex-Partner: No    Emotionally Abused: No    Physically Abused: No    Sexually Abused: No      Review of Systems  Constitutional:  Negative for chills, fatigue and unexpected weight change.  HENT:  Positive for postnasal drip. Negative for congestion, rhinorrhea, sneezing and sore throat.   Eyes:  Negative for redness.  Respiratory:  Negative for cough, chest tightness and shortness of breath.   Cardiovascular:  Negative for chest pain and palpitations.  Gastrointestinal:  Negative for abdominal pain, constipation, diarrhea, nausea and vomiting.  Genitourinary:  Negative for dysuria and frequency.  Musculoskeletal:  Negative for arthralgias, back pain, joint swelling and neck pain.  Skin:  Negative for rash.  Neurological: Negative.  Negative for tremors and numbness.  Hematological:  Negative for adenopathy. Does not bruise/bleed easily.  Psychiatric/Behavioral:  Negative for behavioral problems (Depression), sleep disturbance and suicidal ideas. The patient is not nervous/anxious.     Vital Signs: BP (!) 142/78   Pulse 68   Temp 98.3 F (36.8 C)   Resp 16   Ht 5' 11 (1.803 m)   Wt 203 lb (92.1 kg)   SpO2 97%   BMI 28.31 kg/m    Physical Exam Constitutional:      Appearance: Normal  appearance.  HENT:     Head: Normocephalic and atraumatic.     Nose: Nose normal.     Mouth/Throat:     Mouth: Mucous membranes are moist.     Pharynx: No posterior oropharyngeal erythema.  Eyes:     Extraocular Movements: Extraocular movements intact.     Pupils: Pupils are equal, round, and reactive to light.  Cardiovascular:     Pulses: Normal pulses.     Heart sounds: Normal heart sounds.  Pulmonary:     Effort: Pulmonary effort is normal.     Breath sounds: Normal breath sounds.  Neurological:  General: No focal deficit present.     Mental Status: He is alert.  Psychiatric:        Mood and Affect: Mood normal.        Behavior: Behavior normal.        Assessment/Plan: 1. Abnormal creatinine clearance glomerular filtration (Primary) Will recheck renal functions, along egfr   2. Benign hypertension Continue norvasc  as before  - CMP14+EGFR  3. BPH with obstruction/lower urinary tract symptoms Followed by urology   4. Hx of total hip arthroplasty, left Improving gradually    General Counseling: Helayne oakland understanding of the findings of todays visit and agrees with plan of treatment. I have discussed any further diagnostic evaluation that may be needed or ordered today. We also reviewed his medications today. he has been encouraged to call the office with any questions or concerns that should arise related to todays visit.    Orders Placed This Encounter  Procedures   CMP14+EGFR    No orders of the defined types were placed in this encounter.   Total time spent:25 Minutes Time spent includes review of chart, medications, test results, and follow up plan with the patient.   [place] Controlled Substance Database was reviewed by me.   Dr Oaklee Esther M Anevay Campanella Internal medicine

## 2024-02-01 LAB — CMP14+EGFR
ALT: 14 IU/L (ref 0–44)
AST: 18 IU/L (ref 0–40)
Albumin: 4.4 g/dL (ref 3.7–4.7)
Alkaline Phosphatase: 82 IU/L (ref 44–121)
BUN/Creatinine Ratio: 12 (ref 10–24)
BUN: 19 mg/dL (ref 8–27)
Bilirubin Total: 0.5 mg/dL (ref 0.0–1.2)
CO2: 22 mmol/L (ref 20–29)
Calcium: 9.8 mg/dL (ref 8.6–10.2)
Chloride: 105 mmol/L (ref 96–106)
Creatinine, Ser: 1.64 mg/dL — ABNORMAL HIGH (ref 0.76–1.27)
Globulin, Total: 2.4 g/dL (ref 1.5–4.5)
Glucose: 86 mg/dL (ref 70–99)
Potassium: 4.7 mmol/L (ref 3.5–5.2)
Sodium: 139 mmol/L (ref 134–144)
Total Protein: 6.8 g/dL (ref 6.0–8.5)
eGFR: 42 mL/min/1.73 — ABNORMAL LOW (ref >59)

## 2024-02-07 NOTE — Addendum Note (Signed)
 Addended by: FERNAND SIGRID HERO on: 02/07/2024 12:15 PM   Modules accepted: Orders

## 2024-02-08 ENCOUNTER — Telehealth: Payer: Self-pay | Admitting: Internal Medicine

## 2024-02-08 NOTE — Telephone Encounter (Signed)
 Nephrology referral sent via [facility] to [facility]. Notified patient. Gave telephone # [phone]

## 2024-02-13 ENCOUNTER — Telehealth: Payer: Self-pay | Admitting: Internal Medicine

## 2024-02-13 NOTE — Telephone Encounter (Signed)
 Nephrology appointment 02/16/2024 with [facility] [place]

## 2024-02-20 ENCOUNTER — Other Ambulatory Visit: Payer: Self-pay | Admitting: Nephrology

## 2024-02-20 DIAGNOSIS — I129 Hypertensive chronic kidney disease with stage 1 through stage 4 chronic kidney disease, or unspecified chronic kidney disease: Secondary | ICD-10-CM

## 2024-02-20 DIAGNOSIS — N1832 Chronic kidney disease, stage 3b: Secondary | ICD-10-CM

## 2024-02-20 DIAGNOSIS — Z87442 Personal history of urinary calculi: Secondary | ICD-10-CM

## 2024-02-27 ENCOUNTER — Ambulatory Visit
Admission: RE | Admit: 2024-02-27 | Discharge: 2024-02-27 | Disposition: A | Discharge status: Home / Self Care | Source: Ambulatory Visit | Attending: Nephrology | Admitting: Nephrology

## 2024-02-27 DIAGNOSIS — I129 Hypertensive chronic kidney disease with stage 1 through stage 4 chronic kidney disease, or unspecified chronic kidney disease: Secondary | ICD-10-CM | POA: Insufficient documentation

## 2024-02-27 DIAGNOSIS — N1832 Chronic kidney disease, stage 3b: Secondary | ICD-10-CM | POA: Diagnosis present

## 2024-02-27 DIAGNOSIS — Z87442 Personal history of urinary calculi: Secondary | ICD-10-CM | POA: Diagnosis present

## 2024-04-10 ENCOUNTER — Ambulatory Visit: Admitting: Internal Medicine

## 2024-04-10 ENCOUNTER — Encounter: Payer: Self-pay | Admitting: Internal Medicine

## 2024-04-10 VITALS — BP 140/86 | HR 71 | Temp 98.3°F | Resp 16 | Ht 71.0 in | Wt 201.6 lb

## 2024-04-10 DIAGNOSIS — I10 Essential (primary) hypertension: Secondary | ICD-10-CM

## 2024-04-10 DIAGNOSIS — Z0001 Encounter for general adult medical examination with abnormal findings: Secondary | ICD-10-CM

## 2024-04-10 DIAGNOSIS — Z96642 Presence of left artificial hip joint: Secondary | ICD-10-CM | POA: Diagnosis not present

## 2024-04-10 DIAGNOSIS — N1831 Chronic kidney disease, stage 3a: Secondary | ICD-10-CM | POA: Diagnosis not present

## 2024-04-10 DIAGNOSIS — R300 Dysuria: Secondary | ICD-10-CM | POA: Diagnosis not present

## 2024-04-10 MED ORDER — AMLODIPINE BESYLATE 2.5 MG PO TABS: ORAL_TABLET | ORAL | 3 refills | Status: AC

## 2024-04-10 NOTE — Progress Notes (Signed)
 [facility] [address] [place], [place] [address]  Internal MEDICINE  Office Visit Note  Patient Name: Henry Shepard  [id number]  [id number]  Date of Service: 04/10/2024  Chief Complaint  Patient presents with   Medicare Wellness    GERD, HTN     HPI Pt is here for routine health maintenance examination He is feeling well, seen nephrology for CKD, Cr is improved  Elevtaed BP   Current Medication: Outpatient Encounter Medications as of 04/10/2024  Medication Sig   Acetaminophen  500 MG capsule Take 1,000 mg by mouth every 6 (six) hours as needed for moderate pain (pain score 4-6) or mild pain (pain score 1-3).   allopurinol  (ZYLOPRIM ) 100 MG tablet Take one tab at night for gout   amLODipine  (NORVASC ) 5 MG tablet Take one tab po every day for blood pressure   cholecalciferol (VITAMIN D3) 25 MCG (1000 UNIT) tablet Take 1,000 Units by mouth daily.   Multiple Vitamins-Minerals (MULTIVITAMIN WITH MINERALS) tablet Take 1 tablet by mouth daily. Men 50+   tamsulosin  (FLOMAX ) 0.4 MG CAPS capsule Take 1 capsule (0.4 mg total) by mouth daily.   [DISCONTINUED] aspirin  81 MG chewable tablet Chew 1 tablet (81 mg total) by mouth 2 (two) times daily.   No facility-administered encounter medications on file as of 04/10/2024.    Surgical History: Past Surgical History:  Procedure Laterality Date   COLONOSCOPY W/ BIOPSIES AND POLYPECTOMY  2018   ESOPHAGEAL DILATION  2018   none     TOTAL HIP ARTHROPLASTY Left 12/05/2023   Procedure: ARTHROPLASTY, HIP, TOTAL,POSTERIOR APPROACH;  Surgeon: Mardee Lynwood SQUIBB, MD;  Location: [facility] ORS;  Service: Orthopedics;  Laterality: Left;    Medical History: Past Medical History:  Diagnosis Date   Allergy    Asthma    BPH (benign prostatic hyperplasia)    ED (erectile dysfunction)    Elevated PSA    GERD (gastroesophageal reflux disease)    Gout    Greater trochanteric bursitis of right hip 11/24/2017   Gross hematuria     Hypertension    Hypertension    Kidney stone    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   Schatzki's ring 04/11/2017   Thyroid  disease    Urinary frequency     Family History: Family History  Problem Relation Age of Onset   Kidney cancer Neg Hx    Kidney disease Neg Hx    Prostate cancer Neg Hx    Bladder Cancer Neg Hx     Social History: Social History   Socioeconomic History   Marital status: Divorced    Spouse name: Not on file   Number of children: Not on file   Years of education: Not on file   Highest education level: Not on file  Occupational History   Not on file  Tobacco Use   Smoking status: Never   Smokeless tobacco: Never  Vaping Use   Vaping status: Never Used  Substance and Sexual Activity   Alcohol use: No    Alcohol/week: 0.0 standard drinks of alcohol   Drug use: No   Sexual activity: Yes  Other Topics Concern   Not on file  Social History Narrative   Not on file   Social Drivers of Health   Financial Resource Strain: Low Risk  (02/28/2024)   Received from [facility]   Overall Financial Resource Strain (CARDIA)    Difficulty of Paying Living Expenses: Not hard  at all  Food Insecurity: No Food Insecurity (02/28/2024)   [facility]   Hunger Vital Sign    Within the past 12 months, you worried that your food would run out before you got the money to buy more.: Never true    Within the past 12 months, the food you bought just didn't last and you didn't have money to get more.: Never true  Transportation Needs: No Transportation Needs (02/28/2024)   Received from [facility] - Transportation    In the past 12 months, has lack of transportation kept you from medical appointments or from getting medications?: No    Lack of Transportation (Non-Medical): No  Physical Activity: Not on file  Stress: Not on file  Social  Connections: Moderately Isolated (12/05/2023)   Social Connection and Isolation Panel    Frequency of Communication with Friends and Family: Three times a week    Frequency of Social Gatherings with Friends and Family: Once a week    Attends Religious Services: More than 4 times per year    Active Member of [employer] or Organizations: No    Attends Banker Meetings: Never    Marital Status: Divorced      Review of Systems  Constitutional:  Negative for fatigue and fever.  HENT:  Negative for congestion, mouth sores and postnasal drip.   Respiratory:  Negative for cough.   Cardiovascular:  Negative for chest pain.  Genitourinary:  Negative for flank pain.  Psychiatric/Behavioral: Negative.       Vital Signs: BP (!) 140/86   Pulse 71   Temp 98.3 F (36.8 C)   Resp 16   Ht 5' 11 (1.803 m)   Wt 201 lb 9.6 oz (91.4 kg)   SpO2 99%   BMI 28.12 kg/m    Physical Exam Constitutional:      Appearance: Normal appearance.  HENT:     Head: Normocephalic and atraumatic.     Nose: Nose normal.     Mouth/Throat:     Mouth: Mucous membranes are moist.     Pharynx: No posterior oropharyngeal erythema.  Eyes:     Extraocular Movements: Extraocular movements intact.     Pupils: Pupils are equal, round, and reactive to light.  Cardiovascular:     Pulses: Normal pulses.     Heart sounds: Normal heart sounds.  Pulmonary:     Effort: Pulmonary effort is normal.     Breath sounds: Normal breath sounds.  Skin:    General: Skin is warm.  Neurological:     General: No focal deficit present.     Mental Status: He is alert.  Psychiatric:        Mood and Affect: Mood normal.        Behavior: Behavior normal.      LABS: Recent Results (from the past 2160 hours)  CMP14+EGFR     Status: Abnormal   Collection Time: 01/31/24 11:03 AM  Result Value Ref Range   Glucose 86 70 - 99 mg/dL   BUN 19 8 - 27 mg/dL   Creatinine, Ser 8.35 (H) 0.76 - 1.27 mg/dL   eGFR 42 (L) >40  fO/fpw/8.26   BUN/Creatinine Ratio 12 10 - 24   Sodium 139 134 - 144 mmol/L   Potassium 4.7 3.5 - 5.2 mmol/L   Chloride 105 96 - 106 mmol/L   CO2 22 20 - 29 mmol/L   Calcium 9.8 8.6 - 10.2 mg/dL  Total Protein 6.8 6.0 - 8.5 g/dL   Albumin 4.4 3.7 - 4.7 g/dL   Globulin, Total 2.4 1.5 - 4.5 g/dL   Bilirubin Total 0.5 0.0 - 1.2 mg/dL   Alkaline Phosphatase 82 44 - 121 IU/L    Comment: **Effective February 13, 2024 Alkaline Phosphatase**   reference interval will be changing to:              Age                Male          Male           0 -  5 days         47 - 127       47 - 127           6 - 10 days         29 - 242       29 - 242          11 - 20 days        109 - 357      109 - 357          21 - 30 days         94 - 494       94 - 494           1 -  2 months      149 - 539      149 - 539           3 -  6 months      131 - 452      131 - 452           7 - 11 months      117 - 401      117 - 401   12 months -  6 years       158 - 369      158 - 369           7 - 12 years       150 - 409      150 - 409               13 years       156 - 435       78 - 227               14 years       114 - 375       64 - 161               15 years        88 - 279       56 - 134               16 years        74 - 207       51 - 121               17 years        63 - 161       47 - 113          18 - 20 years        51 - 125       42 - 106          21 - 50 years  47 - 123       41 - 116          51 - 80 years        49 - 135       51 - 125              >80 years        48 - 129       48 - 129    AST 18 0 - 40 IU/L   ALT 14 0 - 44 IU/L        Assessment/Plan: 1. Encounter for general adult medical examination with abnormal findings (Primary) Pt is upto date all PHM, does not wish to have repeat colonoscopy   2. Benign hypertension BP is not under optimum control, increase Norvasc  to 7.5 mg ( 2 RX ( 5mg  + 2.5 mg) po every day   3. Stage 3a chronic kidney disease (HCC) Followed  by nephrology, improving  - UA/M w/rflx Culture, Routine  4. Hx of total hip arthroplasty, left Able to walk better, continue to encourage   5. Dysuria - UA/M w/rflx Culture, Routine   General Counseling: Toshiyuki verbalizes understanding of the findings of todays visit and agrees with plan of treatment. I have discussed any further diagnostic evaluation that may be needed or ordered today. We also reviewed his medications today. he has been encouraged to call the office with any questions or concerns that should arise related to todays visit.    Counseling:  [place] Controlled Substance Database was reviewed by me.  Orders Placed This Encounter  Procedures   UA/M w/rflx Culture, Routine    No orders of the defined types were placed in this encounter.   Total time spent:45 Minutes  Time spent includes review of chart, medications, test results, and follow up plan with the patient.     Sigrid CHRISTELLA Bathe, MD  Internal Medicine

## 2024-04-11 LAB — UA/M W/RFLX CULTURE, ROUTINE
Bilirubin, UA: NEGATIVE
Glucose, UA: NEGATIVE
Ketones, UA: NEGATIVE
Leukocytes,UA: NEGATIVE
Nitrite, UA: NEGATIVE
Protein,UA: NEGATIVE
RBC, UA: NEGATIVE
Specific Gravity, UA: 1.012 (ref 1.005–1.030)
Urobilinogen, Ur: 0.2 mg/dL (ref 0.2–1.0)
pH, UA: 6.5 (ref 5.0–7.5)

## 2024-04-11 LAB — MICROSCOPIC EXAMINATION
Bacteria, UA: NONE SEEN
Casts: NONE SEEN /LPF
Epithelial Cells (non renal): NONE SEEN /HPF (ref 0–10)
RBC, Urine: NONE SEEN /HPF (ref 0–2)
WBC, UA: NONE SEEN /HPF (ref 0–5)

## 2024-10-09 ENCOUNTER — Ambulatory Visit: Admitting: Internal Medicine

## 2025-04-15 ENCOUNTER — Ambulatory Visit: Admitting: Internal Medicine
